# Patient Record
Sex: Female | Born: 1960 | Race: White | Hispanic: No | Marital: Married | Smoking: Former smoker
Health system: Southern US, Community
[De-identification: ages and names within clinical notes are randomized; demographics above are authoritative.]

## PROBLEM LIST (undated history)

## (undated) DIAGNOSIS — N939 Abnormal uterine and vaginal bleeding, unspecified: Secondary | ICD-10-CM

## (undated) DIAGNOSIS — N289 Disorder of kidney and ureter, unspecified: Secondary | ICD-10-CM

## (undated) DIAGNOSIS — K219 Gastro-esophageal reflux disease without esophagitis: Secondary | ICD-10-CM

## (undated) DIAGNOSIS — K449 Diaphragmatic hernia without obstruction or gangrene: Secondary | ICD-10-CM

## (undated) DIAGNOSIS — I10 Essential (primary) hypertension: Secondary | ICD-10-CM

## (undated) DIAGNOSIS — F419 Anxiety disorder, unspecified: Secondary | ICD-10-CM

## (undated) DIAGNOSIS — D259 Leiomyoma of uterus, unspecified: Secondary | ICD-10-CM

## (undated) HISTORY — DX: Gastro-esophageal reflux disease without esophagitis: K21.9

## (undated) HISTORY — DX: Diaphragmatic hernia without obstruction or gangrene: K44.9

## (undated) HISTORY — PX: DILATION AND CURETTAGE OF UTERUS: SHX78

## (undated) HISTORY — DX: Leiomyoma of uterus, unspecified: D25.9

## (undated) HISTORY — DX: Anxiety disorder, unspecified: F41.9

## (undated) HISTORY — DX: Essential (primary) hypertension: I10

## (undated) HISTORY — PX: TUBAL LIGATION: SHX77

---

## 2005-06-03 ENCOUNTER — Ambulatory Visit: Payer: Self-pay

## 2005-06-17 ENCOUNTER — Ambulatory Visit: Payer: Self-pay | Admitting: Obstetrics & Gynecology

## 2006-02-28 ENCOUNTER — Ambulatory Visit: Payer: Self-pay

## 2006-08-04 ENCOUNTER — Ambulatory Visit: Payer: Self-pay | Admitting: General Surgery

## 2006-08-04 HISTORY — PX: COLONOSCOPY: SHX174

## 2006-08-04 HISTORY — PX: UPPER GI ENDOSCOPY: SHX6162

## 2007-08-29 ENCOUNTER — Ambulatory Visit: Payer: Self-pay | Admitting: Obstetrics & Gynecology

## 2009-03-13 ENCOUNTER — Ambulatory Visit: Payer: Self-pay

## 2010-07-06 ENCOUNTER — Ambulatory Visit: Payer: Self-pay

## 2010-11-24 LAB — HEMOGLOBIN A1C: HEMOGLOBIN A1C: 5.7

## 2011-11-24 ENCOUNTER — Ambulatory Visit: Payer: Self-pay

## 2012-03-08 ENCOUNTER — Inpatient Hospital Stay: Payer: Self-pay | Admitting: Internal Medicine

## 2012-03-08 LAB — CK: CK, Total: 44 U/L (ref 21–215)

## 2012-03-08 LAB — URINALYSIS, COMPLETE
Ph: 5 (ref 4.5–8.0)
Protein: 100
RBC,UR: 13 /HPF (ref 0–5)
Specific Gravity: 1.008 (ref 1.003–1.030)
Squamous Epithelial: 3
Transitional Epi: <1
WBC UR: 94 /HPF (ref 0–5)

## 2012-03-08 LAB — COMPREHENSIVE METABOLIC PANEL
Chloride: 84 mmol/L — ABNORMAL LOW (ref 98–107)
Co2: 23 mmol/L (ref 21–32)
Creatinine: 3.99 mg/dL — ABNORMAL HIGH (ref 0.60–1.30)
EGFR (African American): 14 — ABNORMAL LOW
EGFR (Non-African Amer.): 12 — ABNORMAL LOW
Glucose: 143 mg/dL — ABNORMAL HIGH (ref 65–99)
Potassium: 3 mmol/L — ABNORMAL LOW (ref 3.5–5.1)
SGOT(AST): 38 U/L — ABNORMAL HIGH (ref 15–37)
SGPT (ALT): 41 U/L

## 2012-03-08 LAB — LIPASE, BLOOD: Lipase: 39 U/L — ABNORMAL LOW (ref 73–393)

## 2012-03-08 LAB — CBC
MCHC: 34.9 g/dL (ref 32.0–36.0)
MCV: 101 fL — ABNORMAL HIGH (ref 80–100)
Platelet: 128 10*3/uL — ABNORMAL LOW (ref 150–440)

## 2012-03-08 LAB — MAGNESIUM: Magnesium: 1 mg/dL — ABNORMAL LOW

## 2012-03-09 LAB — BASIC METABOLIC PANEL
BUN: 41 mg/dL — ABNORMAL HIGH (ref 7–18)
Calcium, Total: 7.1 mg/dL — ABNORMAL LOW (ref 8.5–10.1)
Chloride: 93 mmol/L — ABNORMAL LOW (ref 98–107)
Co2: 17 mmol/L — ABNORMAL LOW (ref 21–32)
Creatinine: 3.81 mg/dL — ABNORMAL HIGH (ref 0.60–1.30)
EGFR (African American): 15 — ABNORMAL LOW
Glucose: 101 mg/dL — ABNORMAL HIGH (ref 65–99)
Osmolality: 262 (ref 275–301)
Potassium: 2.9 mmol/L — ABNORMAL LOW (ref 3.5–5.1)
Sodium: 125 mmol/L — ABNORMAL LOW (ref 136–145)

## 2012-03-09 LAB — MAGNESIUM: Magnesium: 1.6 mg/dL — ABNORMAL LOW

## 2012-03-09 LAB — CK: CK, Total: 38 U/L (ref 21–215)

## 2012-03-10 LAB — COMPREHENSIVE METABOLIC PANEL
Albumin: 2 g/dL — ABNORMAL LOW (ref 3.4–5.0)
Alkaline Phosphatase: 87 U/L (ref 50–136)
Anion Gap: 12 (ref 7–16)
BUN: 43 mg/dL — ABNORMAL HIGH (ref 7–18)
Calcium, Total: 7.3 mg/dL — ABNORMAL LOW (ref 8.5–10.1)
Chloride: 95 mmol/L — ABNORMAL LOW (ref 98–107)
Co2: 15 mmol/L — ABNORMAL LOW (ref 21–32)
Creatinine: 3.67 mg/dL — ABNORMAL HIGH (ref 0.60–1.30)
EGFR (Non-African Amer.): 14 — ABNORMAL LOW
Glucose: 106 mg/dL — ABNORMAL HIGH (ref 65–99)
Osmolality: 257 (ref 275–301)
Potassium: 3.1 mmol/L — ABNORMAL LOW (ref 3.5–5.1)
SGOT(AST): 14 U/L — ABNORMAL LOW (ref 15–37)
Sodium: 122 mmol/L — ABNORMAL LOW (ref 136–145)

## 2012-03-10 LAB — CBC WITH DIFFERENTIAL/PLATELET
Basophil %: 0.1 %
Eosinophil #: 0.2 10*3/uL (ref 0.0–0.7)
Eosinophil %: 2 %
MCH: 35.6 pg — ABNORMAL HIGH (ref 26.0–34.0)
MCHC: 34.9 g/dL (ref 32.0–36.0)
MCV: 102 fL — ABNORMAL HIGH (ref 80–100)
Monocyte #: 1.5 x10 3/mm — ABNORMAL HIGH (ref 0.2–0.9)
Monocyte %: 14.9 %
Neutrophil #: 8 10*3/uL — ABNORMAL HIGH (ref 1.4–6.5)
Platelet: 96 10*3/uL — ABNORMAL LOW (ref 150–440)
RBC: 2.91 10*6/uL — ABNORMAL LOW (ref 3.80–5.20)
RDW: 13.5 % (ref 11.5–14.5)
WBC: 9.9 10*3/uL (ref 3.6–11.0)

## 2012-03-10 LAB — URINE CULTURE

## 2012-03-10 LAB — PROTEIN / CREATININE RATIO, URINE
Protein, Random Urine: 31 mg/dL — ABNORMAL HIGH (ref 0–12)
Protein/Creat. Ratio: 1498 mg/gCREAT — ABNORMAL HIGH (ref 0–200)

## 2012-03-10 LAB — MAGNESIUM: Magnesium: 2 mg/dL

## 2012-03-11 LAB — CBC WITH DIFFERENTIAL/PLATELET
Eosinophil #: 0.2 10*3/uL (ref 0.0–0.7)
Eosinophil %: 2.4 %
HCT: 28.1 % — ABNORMAL LOW (ref 35.0–47.0)
HGB: 9.8 g/dL — ABNORMAL LOW (ref 12.0–16.0)
Lymphocyte %: 3 %
MCH: 35.6 pg — ABNORMAL HIGH (ref 26.0–34.0)
MCHC: 34.9 g/dL (ref 32.0–36.0)
MCV: 102 fL — ABNORMAL HIGH (ref 80–100)
Monocyte #: 1.5 x10 3/mm — ABNORMAL HIGH (ref 0.2–0.9)
Monocyte %: 15.3 %
Neutrophil #: 7.8 10*3/uL — ABNORMAL HIGH (ref 1.4–6.5)
Neutrophil %: 79.1 %
RBC: 2.76 10*6/uL — ABNORMAL LOW (ref 3.80–5.20)
WBC: 9.8 10*3/uL (ref 3.6–11.0)

## 2012-03-11 LAB — CULTURE, BLOOD (SINGLE)

## 2012-03-11 LAB — BASIC METABOLIC PANEL
BUN: 35 mg/dL — ABNORMAL HIGH (ref 7–18)
Calcium, Total: 7.7 mg/dL — ABNORMAL LOW (ref 8.5–10.1)
Chloride: 101 mmol/L (ref 98–107)
Co2: 19 mmol/L — ABNORMAL LOW (ref 21–32)
EGFR (African American): 19 — ABNORMAL LOW
Osmolality: 269 (ref 275–301)
Sodium: 130 mmol/L — ABNORMAL LOW (ref 136–145)

## 2012-03-11 LAB — PROTIME-INR
INR: 1
Prothrombin Time: 13.9 secs (ref 11.5–14.7)

## 2012-03-11 LAB — APTT: Activated PTT: 27.8 secs (ref 23.6–35.9)

## 2012-03-11 LAB — FIBRIN DEGRADATION PROD.(ARMC ONLY): Fibrin Degradation Prod.: >10 — ABNORMAL HIGH (ref 2.1–7.7)

## 2012-03-12 LAB — BASIC METABOLIC PANEL
Anion Gap: 14 (ref 7–16)
BUN: 27 mg/dL — ABNORMAL HIGH (ref 7–18)
Calcium, Total: 8.4 mg/dL — ABNORMAL LOW (ref 8.5–10.1)
Co2: 19 mmol/L — ABNORMAL LOW (ref 21–32)
Creatinine: 2.29 mg/dL — ABNORMAL HIGH (ref 0.60–1.30)
EGFR (African American): 28 — ABNORMAL LOW
EGFR (Non-African Amer.): 24 — ABNORMAL LOW
Osmolality: 277 (ref 275–301)
Potassium: 3.1 mmol/L — ABNORMAL LOW (ref 3.5–5.1)
Sodium: 136 mmol/L (ref 136–145)

## 2012-03-12 LAB — CBC WITH DIFFERENTIAL/PLATELET
Basophil %: 0.3 %
Lymphocyte #: 0.5 10*3/uL — ABNORMAL LOW (ref 1.0–3.6)
MCH: 35.2 pg — ABNORMAL HIGH (ref 26.0–34.0)
MCHC: 34 g/dL (ref 32.0–36.0)
Monocyte #: 1.1 x10 3/mm — ABNORMAL HIGH (ref 0.2–0.9)
Monocyte %: 12.5 %
Platelet: 172 10*3/uL (ref 150–440)
RBC: 3.05 10*6/uL — ABNORMAL LOW (ref 3.80–5.20)
RDW: 13.9 % (ref 11.5–14.5)
WBC: 8.9 10*3/uL (ref 3.6–11.0)

## 2012-03-13 LAB — UR PROT ELECTROPHORESIS, URINE RANDOM

## 2014-03-26 LAB — BASIC METABOLIC PANEL
Creatinine: 1.1 mg/dL (ref 0.5–1.1)
Glucose: 99 mg/dL

## 2014-03-26 LAB — LIPID PANEL
CHOLESTEROL: 201 mg/dL — AB (ref 0–200)
HDL: 52 mg/dL (ref 35–70)
LDL Cholesterol: 81 mg/dL
Triglycerides: 342 mg/dL — AB (ref 40–160)

## 2014-05-01 ENCOUNTER — Ambulatory Visit (INDEPENDENT_AMBULATORY_CARE_PROVIDER_SITE_OTHER): Payer: BC Managed Care – PPO | Admitting: Cardiovascular Disease

## 2014-05-01 ENCOUNTER — Encounter (INDEPENDENT_AMBULATORY_CARE_PROVIDER_SITE_OTHER): Payer: Self-pay

## 2014-05-01 ENCOUNTER — Encounter: Payer: Self-pay | Admitting: Cardiovascular Disease

## 2014-05-01 VITALS — BP 140/92 | HR 66 | Ht 65.0 in | Wt 178.5 lb

## 2014-05-01 DIAGNOSIS — I158 Other secondary hypertension: Secondary | ICD-10-CM

## 2014-05-01 DIAGNOSIS — M7989 Other specified soft tissue disorders: Secondary | ICD-10-CM | POA: Insufficient documentation

## 2014-05-01 DIAGNOSIS — F419 Anxiety disorder, unspecified: Secondary | ICD-10-CM

## 2014-05-01 DIAGNOSIS — I10 Essential (primary) hypertension: Secondary | ICD-10-CM | POA: Insufficient documentation

## 2014-05-01 DIAGNOSIS — F411 Generalized anxiety disorder: Secondary | ICD-10-CM

## 2014-05-01 MED ORDER — LOSARTAN POTASSIUM 100 MG PO TABS: 100.0000 mg | ORAL_TABLET | Freq: Every day | ORAL | Status: DC

## 2014-05-01 NOTE — Assessment & Plan Note (Addendum)
For her blood pressure, we have suggested she decrease the amlodipine down to 5 mg daily. We'll add losartan 50 mg daily. If her blood pressure continues to run high, we would increase the losartan up to 100 mg daily. She has a blood pressure cuff and will monitor her blood pressure at home

## 2014-05-01 NOTE — Patient Instructions (Addendum)
Please cut the norvasc in 1/2 daily Please start 1/2 pill of the losartan daily  Monitor your blood pressure If it runs high (top number <140, bottom <90) Increase the losartan up to a full pill  Fish oil is good for triglycerides only Red Yeast Rice (OTC) for total cholestol  Please call us if you have new issues that need to be addressed before your next appt.

## 2014-05-01 NOTE — Assessment & Plan Note (Signed)
Minimal leg edema on today's visit. She has side effects from the amlodipine most likely, exacerbated by the warmer summer weather. We have recommended she decrease the amlodipine as above

## 2014-05-01 NOTE — Progress Notes (Signed)
   Patient ID: Mary Christian, female    DOB: 08/21/1961, 53 y.o.   MRN: [id number]  HPI Comments: Mary Christian is a 53 year old woman with a history of hypertension, anxiety, previous episode of acute renal failure from dehydration, urinary tract infection in June 2013, who presents for new patient evaluation for hypertension.  Reports that she was previously on Ziac and HCTZ in separate pills.  She was changed to Norvasc approximately 6 weeks ago. Since then she has had worsening lower extremity edema. Initially she was on a lower dose, changed to 10 mg. Symptoms started on the higher dose. She would like an alternate medication as her ankle edema has been significant on certain days. Otherwise she feels well, is active, leads a busy lifestyle. Works for a [employer]. No regular exercise. Weight has been slowly trending upwards She reports that her swelling is not very bad today  EKG shows normal sinus rhythm with rate 66 beats a minute, no significant ST or T wave changes    Outpatient Encounter Prescriptions as of 05/01/2014  Medication Sig  . amLODipine (NORVASC) 10 MG tablet Take 10 mg by mouth daily.  Marland Kitchen CALCIUM CITRATE-VITAMIN D PO Take 630 mg by mouth daily.  . cetirizine (ZYRTEC) 10 MG tablet Take 10 mg by mouth daily.  . Flaxseed, Linseed, (FLAXSEED OIL) 1000 MG CAPS Take 1,000 mg by mouth daily.  Marland Kitchen losartan (COZAAR) 100 MG tablet Take 1 tablet (100 mg total) by mouth daily.  Marland Kitchen omeprazole (PRILOSEC) 20 MG capsule Take 20 mg by mouth daily.  Marland Kitchen PARoxetine (PAXIL) 20 MG tablet Take 20 mg by mouth daily.   . psyllium (REGULOID) 0.52 G capsule Take 0.52 g by mouth daily.    Review of Systems  Constitutional: Negative.   HENT: Negative.   Eyes: Negative.   Respiratory: Negative.   Cardiovascular: Positive for leg swelling.  Gastrointestinal: Negative.   Endocrine: Negative.   Musculoskeletal: Negative.   Skin: Negative.   Allergic/Immunologic: Negative.   Neurological: Negative.    Hematological: Negative.   Psychiatric/Behavioral: Negative.   All other systems reviewed and are negative.   BP 140/92  Pulse 66  Ht 5\' 5"  (1.651 m)  Wt 178 lb 8 oz (80.967 kg)  BMI 29.70 kg/m2  Physical Exam  Nursing note and vitals reviewed. Constitutional: She is oriented to person, place, and time. She appears well-developed and well-nourished.  HENT:  Head: Normocephalic.  Nose: Nose normal.  Mouth/Throat: Oropharynx is clear and moist.  Eyes: Conjunctivae are normal. Pupils are equal, round, and reactive to light.  Neck: Normal range of motion. Neck supple. No JVD present.  Cardiovascular: Normal rate, regular rhythm, S1 normal, S2 normal, normal heart sounds and intact distal pulses.  Exam reveals no gallop and no friction rub.   No murmur heard. Pulmonary/Chest: Effort normal and breath sounds normal. No respiratory distress. She has no wheezes. She has no rales. She exhibits no tenderness.  Abdominal: Soft. Bowel sounds are normal. She exhibits no distension. There is no tenderness.  Musculoskeletal: Normal range of motion. She exhibits no edema and no tenderness.  Lymphadenopathy:    She has no cervical adenopathy.  Neurological: She is alert and oriented to person, place, and time. Coordination normal.  Skin: Skin is warm and dry. No rash noted. No erythema.  Psychiatric: She has a normal mood and affect. Her behavior is normal. Judgment and thought content normal.    Assessment and Plan

## 2014-05-15 ENCOUNTER — Other Ambulatory Visit: Payer: Self-pay

## 2014-05-15 ENCOUNTER — Encounter: Payer: Self-pay | Admitting: Cardiovascular Disease

## 2014-05-15 MED ORDER — AMLODIPINE BESYLATE 10 MG PO TABS
10.0000 mg | ORAL_TABLET | Freq: Every day | ORAL | Status: DC
Start: 2014-05-15 — End: 2014-12-07

## 2014-12-07 ENCOUNTER — Other Ambulatory Visit: Payer: Self-pay | Admitting: Cardiovascular Disease

## 2014-12-20 ENCOUNTER — Ambulatory Visit (INDEPENDENT_AMBULATORY_CARE_PROVIDER_SITE_OTHER): Payer: 59 | Admitting: Cardiovascular Disease

## 2014-12-20 ENCOUNTER — Encounter: Payer: Self-pay | Admitting: Cardiovascular Disease

## 2014-12-20 VITALS — BP 150/80 | HR 94 | Ht 65.0 in | Wt 178.2 lb

## 2014-12-20 DIAGNOSIS — I10 Essential (primary) hypertension: Secondary | ICD-10-CM | POA: Diagnosis not present

## 2014-12-20 MED ORDER — LOSARTAN POTASSIUM 100 MG PO TABS: 100.0000 mg | ORAL_TABLET | Freq: Every day | ORAL | Status: DC

## 2014-12-20 MED ORDER — AMLODIPINE BESYLATE 5 MG PO TABS: 5.0000 mg | ORAL_TABLET | Freq: Every day | ORAL | Status: DC

## 2014-12-20 MED ORDER — HYDROCHLOROTHIAZIDE 25 MG PO TABS: 25.0000 mg | ORAL_TABLET | Freq: Every day | ORAL | Status: DC

## 2014-12-20 NOTE — Patient Instructions (Addendum)
You are doing well.  Please take HCTZ every other day or 1/2 pill daily for blood pressure Take with banana or potassium every other day  Please call us if you have new issues that need to be addressed before your next appt.  Your physician wants you to follow-up in: 12 months.  You will receive a reminder letter in the mail two months in advance. If you don't receive a letter, please call our office to schedule the follow-up appointment.

## 2014-12-20 NOTE — Assessment & Plan Note (Addendum)
For blood pressure, we have recommended she start HCTZ every other day or one half pill daily She does report having previous episodes of renal failure in the setting of urinary tract infection She states having basic metabolic panel done through primary care last year which was normal

## 2014-12-20 NOTE — Progress Notes (Signed)
Patient ID: Mary Christian, female    DOB: 07-Sep-1961, 54 y.o.   MRN: [id number]  HPI Comments: [name] is a 54 year old woman with a history of hypertension, anxiety, previous episode of acute renal failure from dehydration, urinary tract infection in June 2013, who presents for  Follow-up of her hypertension  She is taking amlodipine 5 mill grams daily, losartan 100 mg daily. Systolic pressure is running between 140 and 150 on a regular basis. She denies any significant side effects. Leg edema has resolved with lower dose amlodipine  EKG on today's visit shows normal sinus rhythm with rate 94 bpm, rare APCs, no significant ST or T-wave changes  Other past medical history  previously on Ziac and HCTZ in separate pills.  She was changed to Norvasc   Initially she was on a lower dose, changed to 10 mg. started having leg edema      No Known Allergies  Outpatient Encounter Prescriptions as of 12/20/2014  Medication Sig  . amLODipine (NORVASC) 5 MG tablet Take 1 tablet (5 mg total) by mouth daily.  Marland Kitchen CALCIUM CITRATE-VITAMIN D PO Take 630 mg by mouth daily.  . cetirizine (ZYRTEC) 10 MG tablet Take 10 mg by mouth daily.  . Flaxseed, Linseed, (FLAXSEED OIL) 1000 MG CAPS Take 1,000 mg by mouth daily.  Marland Kitchen losartan (COZAAR) 100 MG tablet Take 1 tablet (100 mg total) by mouth daily.  Marland Kitchen omeprazole (PRILOSEC) 20 MG capsule Take 20 mg by mouth daily.  Marland Kitchen PARoxetine (PAXIL) 20 MG tablet Take 20 mg by mouth daily.   . psyllium (REGULOID) 0.52 G capsule Take 0.52 g by mouth daily.  . [DISCONTINUED] amLODipine (NORVASC) 10 MG tablet TAKE ONE TABLET BY MOUTH ONE TIME DAILY   . [DISCONTINUED] losartan (COZAAR) 100 MG tablet Take 1 tablet (100 mg total) by mouth daily.  . hydrochlorothiazide (HYDRODIURIL) 25 MG tablet Take 1 tablet (25 mg total) by mouth daily.    Past Medical History  Diagnosis Date  . Hypertension   . GERD (gastroesophageal reflux disease)   . Hiatal hernia   . Anxiety     Past  Surgical History  Procedure Laterality Date  . Cesarean section      Social History  reports that she quit smoking about 29 years ago. Her smoking use included Cigarettes. She has a 3 pack-year smoking history. She does not have any smokeless tobacco history on file. She reports that she drinks about 0.6 oz of alcohol per week. She reports that she does not use illicit drugs.  Family History family history includes Heart attack (age of onset: 72) in her father; Heart disease in her father; Hyperlipidemia in her sister and sister; Hypertension in her brother, father, sister, and sister.   Review of Systems  Constitutional: Negative.   Respiratory: Negative.   Cardiovascular: Negative.   Gastrointestinal: Negative.   Musculoskeletal: Negative.   Skin: Negative.   Neurological: Negative.   Hematological: Negative.   Psychiatric/Behavioral: Negative.   All other systems reviewed and are negative.   BP 150/80 mmHg  Pulse 94  Ht 5\' 5"  (1.651 m)  Wt 178 lb 4 oz (80.854 kg)  BMI 29.66 kg/m2  Physical Exam  Constitutional: She is oriented to person, place, and time. She appears well-developed and well-nourished.  HENT:  Head: Normocephalic.  Nose: Nose normal.  Mouth/Throat: Oropharynx is clear and moist.  Eyes: Conjunctivae are normal. Pupils are equal, round, and reactive to light.  Neck: Normal range of motion. Neck supple.  No JVD present.  Cardiovascular: Normal rate, regular rhythm, S1 normal, S2 normal, normal heart sounds and intact distal pulses.  Exam reveals no gallop and no friction rub.   No murmur heard. Pulmonary/Chest: Effort normal and breath sounds normal. No respiratory distress. She has no wheezes. She has no rales. She exhibits no tenderness.  Abdominal: Soft. Bowel sounds are normal. She exhibits no distension. There is no tenderness.  Musculoskeletal: Normal range of motion. She exhibits no edema or tenderness.  Lymphadenopathy:    She has no cervical  adenopathy.  Neurological: She is alert and oriented to person, place, and time. Coordination normal.  Skin: Skin is warm and dry. No rash noted. No erythema.  Psychiatric: She has a normal mood and affect. Her behavior is normal. Judgment and thought content normal.    Assessment and Plan  Nursing note and vitals reviewed.

## 2015-01-12 NOTE — H&P (Signed)
PATIENT NAME:  Mary, Christian MR#:  [id number] DATE OF BIRTH:  Sep 22, 1960  DATE OF ADMISSION:  03/08/2012  PRIMARY CARE PHYSICIAN: Miguel Aschoff, MD  CHIEF COMPLAINT: Dehydration and muscle weakness.  HISTORY OF PRESENT ILLNESS: This is a 54 year old female who presents with the above complaint. Over the past few days, the patient has had nausea, vomiting, and diarrhea and felt very dehydrated and she says that she has muscle pain all over her body, especially her back and arms. She had some low-grade fever and chills. No chest pain. She has decreased urine output. No dysuria. Positive frequency and urgency. In the ER, she was noted to have a urinary tract infection and elevated creatinine along with hyponatremia and hypokalemia. She is currently taking HCTZ for her blood pressure.   REVIEW OF SYSTEMS: CONSTITUTIONAL: Positive fever, fatigue, and weakness. No weight loss or gain. EYES: No blurred or double vision, glaucoma or cataracts. ENT: No ear pain, hearing loss, or seasonal allergies. RESPIRATORY: No cough, wheezing, hemoptysis, dyspnea, or chronic obstructive pulmonary disease. CARDIOVASCULAR: No chest pain, orthopnea, edema, arrhythmia, dyspnea on exertion, palpitations, or syncope. GI: Positive nausea, vomiting, and diarrhea. No abdominal pain, melena, or ulcers. GENITOURINARY: No dysuria. Positive frequency and urgency. ENDOCRINE: No polyuria, polydipsia, or thyroid problems. HEME/LYMPH: No easy bruising or anemia. SKIN: No rash or lesions. MUSCULOSKELETAL: Generalized weakness. NEURO: No history of CVA, TIA, or seizures.  PSYCH: Positive anxiety. No depression.   PAST MEDICAL HISTORY:  1. Hypertension.  2. Gastroesophageal reflux disease.  3. Anxiety.   MEDICATIONS: 1. Paxil 40 mg daily.  2. Omeprazole 20 mg daily.  3. Bisoprolol/ HCTZ 10/6.25 mg two tablets daily. 4. Flaxseed 2 capsules daily. 5. Fiber Therapy 1 tablet daily.  6. Calcium 600 with D 1 tablet daily.   ALLERGIES: No  known drug allergies.   PAST SURGICAL HISTORY: Cesarean section.   FAMILY HISTORY: Positive for colon cancer, hypertension, and coronary artery disease.   SOCIAL HISTORY: No tobacco. Occasional alcohol. No IV drug use.   PHYSICAL EXAMINATION:   VITAL SIGNS: Temperature 97, pulse 88, respirations 18, blood pressure 103/68, and saturation 100% on room air.   GENERAL: The patient is alert and oriented, not in acute distress.  HEENT: Head is atraumatic. Pupils are round and reactive. Sclerae anicteric. Mucous membranes are moist. Oropharynx is clear.   NECK: Supple without jugular venous distention, carotid bruit, or enlarged thyroid.  CARDIOVASCULAR: Regular rate and rhythm. No murmurs, gallops, or rubs. PMI is not displaced.   LUNGS: Clear to auscultation bilaterally without crackles, rales, rhonchi, or wheezing.   ABDOMEN: Bowel sounds are positive. Nontender and nondistended. No hepatosplenomegaly.   BACK: No CVA or vertebral tenderness.   EXTREMITIES: No clubbing, cyanosis, or edema.   NEURO: Cranial nerves II through XII grossly intact. No focal deficits.   STRENGTH: 4/5 strength bilaterally and symmetrically with generalized weakness. No focal neurological deficit.   SKIN: No rash or lesions.   RESULTS: Sodium 123, potassium 3.0, chloride 84, bicarbonate 23, BUN 42, creatinine 3.99, glucose 143, total protein 7.4, albumin 2.7, bilirubin 0.5, alkaline phosphatase 102, AST 30, and ALT 41. White blood cells 9.5, hemoglobin 13, hematocrit 37.1, and platelets 128.   Urinalysis shows 2+ blood, 3+ leukocyte esterase, 94 white blood cells, and 13 red blood cells.   ASSESSMENT AND PLAN: This is a 54 year old female with a history of hypertension, on HCTZ, who presents with nausea, vomiting, and diarrhea with significant acute renal failure, hyponatremia, hypokalemia, and possibly rhabdomyolysis with  muscle weakness.  1. Muscle weakness - we ordered a CK to rule out rhabdomyolysis.  I  suspect this is due to dehydration, possibly rhabdomyolysis. Pending the CK, for now we will start aggressive IV replacement. She is not on a statin because of this muscle weakness.  2. Acute renal failure from dehydration and HCTZ use - we will obviously stop the HCTZ, hydrate aggressively, and order renal ultrasound to rule out any kind of obstruction.  3. Hyponatremia, likely from HCTZ dehydration - we will hydrate the patient and repeat a BMP in the a.m.  4. Hypokalemia, which was repleted in the emergency room - I suspect this is from her nausea, vomiting, and diarrhea. We will recheck in the a.m.  5. Hypertension - we will hold HCTZ and bisoprolol for now due to dehydration and low/normal blood pressures.  6. Anxiety - we will continue Paxil 40 mg daily.   CODE STATUS: THE PATIENT IS FULL CODE STATUS.   TIME SPENT: Approximately 55 minutes.  ____________________________ Donell Beers. Benjie Karvonen, MD spm:slb D: 03/08/2012 13:45:59 ET     T: 03/08/2012 13:59:51 ET       JOB#: [id number] cc: Mary Christian P. Benjie Karvonen, MD, <Dictator> Mary L. Rosanna Randy, MD Donell Beers Mahogani Holohan MD ELECTRONICALLY SIGNED 03/08/2012 15:36

## 2015-01-12 NOTE — Discharge Summary (Signed)
PATIENT NAME:  Mary Christian, Mary Christian MR#:  [id number] DATE OF BIRTH:  Mary Christian, Mary Christian  DATE OF ADMISSION:  03/08/2012 DATE OF DISCHARGE:  03/12/2012  ADMISSION DIAGNOSES:  1. Acute renal failure from dehydration.  2. Urinary tract infection.  3. Hyponatremia.  4. Hypokalemia.  5. Hypertension.   DISCHARGE DIAGNOSES:  1. E. Coli sepsis secondary to acute pyelonephritis with Escherichia coli.  2. Shortness of breath and wheezing.  3. Acute renal failure.  4. Hypomagnesemia.   5. Hypokalemia.  6. Hyponatremia.  7. Hypertension.  8. Thrombocytopenia.  9. Anxiety.   CONSULTS: Dr. Anthonette Legato   LABS/STUDIES: Sodium 133, potassium 3.1, chloride 103, bicarbonate 19, BUN 27, creatinine 2.29, glucose 93. White blood cells 8.9, hemoglobin 11, hematocrit 32, platelets 172.   Blood cultures positive for Escherichia coli. Urine culture positive for Escherichia coli.   HOSPITAL COURSE: 54 year old female who presented with acute renal failure and weakness. For further details, please refer to the History and Physical.  1. Sepsis secondary to Escherichia coli: Likely source is urine with probable pyelonephritis. Escherichia coli is pansensitive. Due to her renal failure we will discharge her on Keflex with followup in two weeks.  2. Shortness of breath and wheezing: Chest x-ray essentially was normal. This has resolved. She does not require any oxygen.  3. Acute renal failure, suspected from sepsis, NSAID use, hypertensive medications. Appreciate renal consult. Renal ultrasound showed no evidence of hydronephrosis. Her creatinine is much improved with IV fluids.  4. Hypomagnesemia, hypokalemia, hyponatremia: All from sepsis, which were repleted.  5. Hypertension: The patient may resume her bisoprolol. Holding HCTZ due to acute renal failure.  6. Thrombocytopenia from sepsis.  Her platelets are much better as the sepsis is resolving.   DISCHARGE MEDICATIONS:  1. Paxil 20 mg, 2 tablets daily.  2. Omeprazole  20 mg daily.  3. Calcium 600 plus D 1 tablet daily.  4. Fiber therapy 1 tablet daily.  5. Flaxseed oil 2 tablets daily.  6. Acetaminophen hydrocodone 500/5 q. 6-8 hours p.r.n. pain.  7. Keflex 500 mg t.i.d.  8. Dextromethorphan guaifenesin 20 mg/10 mL solution, 5 mL q. 8-12 hours p.r.n.  9. Bisoprolol 10 mg daily.   DISCHARGE DIET: Low sodium.   DISCHARGE ACTIVITY: As tolerated.   DISCHARGE FOLLOWUP:  Follow up in 1 to 2 days with Dr. Holley Raring and her primary care physician Dr. Rosanna Randy.    TIME SPENT: Approximately 35 minutes.  ____________________________ Donell Beers. Benjie Karvonen, MD spm:bjt D: 03/12/2012 13:39:03 ET T: 03/12/2012 15:50:55 ET JOB#: [id number]  cc: Astryd Pearcy P. Benjie Karvonen, MD, <Dictator> Richard L. Rosanna Randy, MD Tama High, MD Donell Beers Franchon Ketterman MD ELECTRONICALLY SIGNED 03/13/2012 13:14

## 2015-05-08 ENCOUNTER — Other Ambulatory Visit: Payer: Self-pay | Admitting: Cardiovascular Disease

## 2015-06-10 ENCOUNTER — Other Ambulatory Visit: Payer: Self-pay | Admitting: Cardiovascular Disease

## 2015-08-27 ENCOUNTER — Other Ambulatory Visit: Payer: Self-pay | Admitting: Cardiovascular Disease

## 2015-08-27 NOTE — Telephone Encounter (Signed)
Refill sent for amlodipine.  

## 2015-09-25 ENCOUNTER — Other Ambulatory Visit: Payer: Self-pay | Admitting: Cardiovascular Disease

## 2015-11-04 ENCOUNTER — Other Ambulatory Visit: Payer: Self-pay | Admitting: *Deleted

## 2015-11-04 MED ORDER — LOSARTAN POTASSIUM 100 MG PO TABS: 100.0000 mg | ORAL_TABLET | Freq: Every day | ORAL | Status: DC

## 2016-01-30 ENCOUNTER — Ambulatory Visit (INDEPENDENT_AMBULATORY_CARE_PROVIDER_SITE_OTHER): Payer: No Typology Code available for payment source | Admitting: Cardiovascular Disease

## 2016-01-30 ENCOUNTER — Encounter: Payer: Self-pay | Admitting: Cardiovascular Disease

## 2016-01-30 VITALS — BP 130/88 | HR 84 | Ht 65.0 in | Wt 177.2 lb

## 2016-01-30 DIAGNOSIS — I10 Essential (primary) hypertension: Secondary | ICD-10-CM | POA: Diagnosis not present

## 2016-01-30 DIAGNOSIS — G4700 Insomnia, unspecified: Secondary | ICD-10-CM | POA: Diagnosis not present

## 2016-01-30 MED ORDER — LOSARTAN POTASSIUM 100 MG PO TABS: 100.0000 mg | ORAL_TABLET | Freq: Every day | ORAL | Status: DC

## 2016-01-30 MED ORDER — HYDROCHLOROTHIAZIDE 25 MG PO TABS: 25.0000 mg | ORAL_TABLET | Freq: Every day | ORAL | Status: DC

## 2016-01-30 NOTE — Assessment & Plan Note (Addendum)
Discussed clonidine, possible side effects such as dry mouth, fatigue. Blood pressure is well controlled on today's visit. No changes made to the medications. We will check basic metabolic panel today

## 2016-01-30 NOTE — Assessment & Plan Note (Signed)
Recommended she try over-the-counter medications, exercise program

## 2016-01-30 NOTE — Progress Notes (Signed)
Patient ID: Mary Christian, female    DOB: [birth year], 55 y.o.   MRN: [id number]  HPI Comments: [name] is a 55 year old woman with a history of hypertension, anxiety, previous episode of acute renal failure from dehydration in 2013, urinary tract infection in June 2013, who presents for  Follow-up of her hypertension  In follow-up, she reports that she is doing well, blood pressure is well controlled No side effects from her medications She does have insomnia, wondering if she can take clonidine at nighttime Sister takes clonidine twice a day, helps her sleep  No regular exercise program No recent lab work available  EKG on today's visit shows normal sinus rhythm with rate 84 bpm, no significant ST or T-wave changes  Other past medical history Previous  Leg edema has resolved with lower dose amlodipine   previously on Ziac and HCTZ in separate pills.  She was changed to Norvasc   Initially she was on a lower dose, changed to 10 mg. started having leg edema      No Known Allergies  Outpatient Encounter Prescriptions as of 01/30/2016  Medication Sig  . amLODipine (NORVASC) 5 MG tablet TAKE ONE TABLET BY MOUTH ONE TIME DAILY  . CALCIUM CITRATE-VITAMIN D PO Take 630 mg by mouth daily.  . cetirizine (ZYRTEC) 10 MG tablet Take 10 mg by mouth daily.  . Flaxseed, Linseed, (FLAXSEED OIL) 1000 MG CAPS Take 1,000 mg by mouth daily.  . hydrochlorothiazide (HYDRODIURIL) 25 MG tablet Take 1 tablet (25 mg total) by mouth daily.  Marland Kitchen losartan (COZAAR) 100 MG tablet Take 1 tablet (100 mg total) by mouth daily.  Marland Kitchen omeprazole (PRILOSEC) 20 MG capsule Take 20 mg by mouth daily.  Marland Kitchen PARoxetine (PAXIL) 20 MG tablet Take 20 mg by mouth daily.   . psyllium (REGULOID) 0.52 G capsule Take 0.52 g by mouth daily.  . [DISCONTINUED] hydrochlorothiazide (HYDRODIURIL) 25 MG tablet Take 1 tablet (25 mg total) by mouth daily.  . [DISCONTINUED] losartan (COZAAR) 100 MG tablet Take 1 tablet (100 mg total) by mouth  daily.   No facility-administered encounter medications on file as of 01/30/2016.    Past Medical History  Diagnosis Date  . Hypertension   . GERD (gastroesophageal reflux disease)   . Hiatal hernia   . Anxiety     Past Surgical History  Procedure Laterality Date  . Cesarean section      Social History  reports that she quit smoking about 30 years ago. Her smoking use included Cigarettes. She has a 3 pack-year smoking history. She does not have any smokeless tobacco history on file. She reports that she drinks about 0.6 oz of alcohol per week. She reports that she does not use illicit drugs.  Family History family history includes Heart attack (age of onset: 37) in her father; Heart disease in her father; Hyperlipidemia in her sister and sister; Hypertension in her brother, father, sister, and sister.   Review of Systems  Constitutional: Negative.   Respiratory: Negative.   Cardiovascular: Negative.   Gastrointestinal: Negative.   Musculoskeletal: Negative.   Neurological: Negative.   Hematological: Negative.   Psychiatric/Behavioral: Positive for sleep disturbance.  All other systems reviewed and are negative.   BP 130/88 mmHg  Pulse 84  Ht 5\' 5"  (1.651 m)  Wt 177 lb 4 oz (80.4 kg)  BMI 29.50 kg/m2  Physical Exam  Constitutional: She is oriented to person, place, and time. She appears well-developed and well-nourished.  HENT:  Head: Normocephalic.  Nose: Nose normal.  Mouth/Throat: Oropharynx is clear and moist.  Eyes: Conjunctivae are normal. Pupils are equal, round, and reactive to light.  Neck: Normal range of motion. Neck supple. No JVD present.  Cardiovascular: Normal rate, regular rhythm, S1 normal, S2 normal, normal heart sounds and intact distal pulses.  Exam reveals no gallop and no friction rub.   No murmur heard. Pulmonary/Chest: Effort normal and breath sounds normal. No respiratory distress. She has no wheezes. She has no rales. She exhibits no  tenderness.  Abdominal: Soft. Bowel sounds are normal. She exhibits no distension. There is no tenderness.  Musculoskeletal: Normal range of motion. She exhibits no edema or tenderness.  Lymphadenopathy:    She has no cervical adenopathy.  Neurological: She is alert and oriented to person, place, and time. Coordination normal.  Skin: Skin is warm and dry. No rash noted. No erythema.  Psychiatric: She has a normal mood and affect. Her behavior is normal. Judgment and thought content normal.    Assessment and Plan  Nursing note and vitals reviewed.

## 2016-01-30 NOTE — Patient Instructions (Addendum)
You are doing well. No medication changes were made.  We will check BMP today, to look at renal function and potassium level  Please call us if you have new issues that need to be addressed before your next appt.  Your physician wants you to follow-up in: 12 months.  You will receive a reminder letter in the mail two months in advance. If you don't receive a letter, please call our office to schedule the follow-up appointment.

## 2016-01-31 LAB — BASIC METABOLIC PANEL
BUN / CREAT RATIO: 16 (ref 9–23)
BUN: 16 mg/dL (ref 6–24)
CHLORIDE: 95 mmol/L — AB (ref 96–106)
CO2: 21 mmol/L (ref 18–29)
Calcium: 9.8 mg/dL (ref 8.7–10.2)
Creatinine, Ser: 0.97 mg/dL (ref 0.57–1.00)
GFR calc Af Amer: 77 mL/min/{1.73_m2} (ref >59)
GFR calc non Af Amer: 67 mL/min/{1.73_m2} (ref >59)
GLUCOSE: 90 mg/dL (ref 65–99)
POTASSIUM: 4.4 mmol/L (ref 3.5–5.2)
SODIUM: 138 mmol/L (ref 134–144)

## 2016-02-19 DIAGNOSIS — D259 Leiomyoma of uterus, unspecified: Secondary | ICD-10-CM

## 2016-02-19 HISTORY — DX: Leiomyoma of uterus, unspecified: D25.9

## 2016-02-29 MED ORDER — BUPIVACAINE-EPINEPHRINE (PF) 0.25% -1:200000 IJ SOLN
INTRAMUSCULAR | Status: AC
  Filled 2016-02-29: qty 30

## 2016-03-01 ENCOUNTER — Encounter
Admission: RE | Admit: 2016-03-01 | Discharge: 2016-03-01 | Disposition: A | Discharge status: Home / Self Care | Payer: No Typology Code available for payment source | Source: Ambulatory Visit | Attending: Obstetrics and Gynecology | Admitting: Obstetrics and Gynecology

## 2016-03-01 DIAGNOSIS — Z01812 Encounter for preprocedural laboratory examination: Secondary | ICD-10-CM | POA: Insufficient documentation

## 2016-03-01 HISTORY — DX: Disorder of kidney and ureter, unspecified: N28.9

## 2016-03-01 HISTORY — DX: Abnormal uterine and vaginal bleeding, unspecified: N93.9

## 2016-03-01 LAB — COMPREHENSIVE METABOLIC PANEL
ALBUMIN: 4.5 g/dL (ref 3.5–5.0)
ALT: 27 U/L (ref 14–54)
AST: 27 U/L (ref 15–41)
Alkaline Phosphatase: 69 U/L (ref 38–126)
Anion gap: 10 (ref 5–15)
BUN: 21 mg/dL — ABNORMAL HIGH (ref 6–20)
CHLORIDE: 102 mmol/L (ref 101–111)
CO2: 24 mmol/L (ref 22–32)
CREATININE: 1.25 mg/dL — AB (ref 0.44–1.00)
Calcium: 9.4 mg/dL (ref 8.9–10.3)
GFR calc non Af Amer: 48 mL/min — ABNORMAL LOW (ref >60)
GFR, EST AFRICAN AMERICAN: 56 mL/min — AB (ref >60)
GLUCOSE: 79 mg/dL (ref 65–99)
Potassium: 4.2 mmol/L (ref 3.5–5.1)
SODIUM: 136 mmol/L (ref 135–145)
Total Bilirubin: 0.3 mg/dL (ref 0.3–1.2)
Total Protein: 7.7 g/dL (ref 6.5–8.1)

## 2016-03-01 LAB — TYPE AND SCREEN
ABO/RH(D): O POS
ANTIBODY SCREEN: NEGATIVE

## 2016-03-01 LAB — CBC
HCT: 39 % (ref 35.0–47.0)
Hemoglobin: 13.8 g/dL (ref 12.0–16.0)
MCH: 34.6 pg — AB (ref 26.0–34.0)
MCHC: 35.3 g/dL (ref 32.0–36.0)
MCV: 97.9 fL (ref 80.0–100.0)
PLATELETS: 277 10*3/uL (ref 150–440)
RBC: 3.99 MIL/uL (ref 3.80–5.20)
RDW: 13.1 % (ref 11.5–14.5)
WBC: 5.9 10*3/uL (ref 3.6–11.0)

## 2016-03-01 NOTE — Patient Instructions (Addendum)
  Your procedure is scheduled on: 03/05/16 Fri Report to Same Day Surgery 2nd floor medical mall To find out your arrival time please call [phone] between 1PM - 3PM on 03/04/16 Thurs  Remember: Instructions that are not followed completely may result in serious medical risk, up to and including death, or upon the discretion of your surgeon and anesthesiologist your surgery may need to be rescheduled.    _x___ 1. Do not eat food or drink liquids after midnight. No gum chewing or hard candies.     __x__ 2. No Alcohol for 24 hours before or after surgery.   ____ 3. Bring all medications with you on the day of surgery if instructed.    __x__ 4. Notify your doctor if there is any change in your medical condition     (cold, fever, infections).     Do not wear jewelry, make-up, hairpins, clips or nail polish.  Do not wear lotions, powders, or perfumes. You may wear deodorant.  Do not shave 48 hours prior to surgery. Men may shave face and neck.  Do not bring valuables to the hospital.    [facility] is not responsible for any belongings or valuables.               Contacts, dentures or bridgework may not be worn into surgery.  Leave your suitcase in the car. After surgery it may be brought to your room.  For patients admitted to the hospital, discharge time is determined by your treatment team.   Patients discharged the day of surgery will not be allowed to drive home.    Please read over the following fact sheets that you were given:   [facility] Preparing for Surgery and or MRSA Information   _x___ Take these medicines the morning of surgery with A SIP OF WATER:    1. amLODipine (NORVASC) 5 MG tablet  2.losartan (COZAAR) 100 MG tablet  3.omeprazole (PRILOSEC) 20 MG capsule  4.PARoxetine (PAXIL) 20 MG tablet  5.  6.  ____ Fleet Enema (as directed)   _x___ Use CHG Soap or sage wipes as directed on instruction sheet   ____ Use inhalers on the day of surgery and bring to  hospital day of surgery  ____ Stop metformin 2 days prior to surgery    ____ Take 1/2 of usual insulin dose the night before surgery and none on the morning of           surgery.   ____ Stop aspirin or coumadin, or plavix  _x__ Stop Anti-inflammatories such as Advil, Aleve, Ibuprofen, Motrin, Naproxen,          Naprosyn, Goodies powders or aspirin products. Ok to take Tylenol.   __x__ Stop supplements until after surgery.  Stopped flax seed a week ago.  ____ Bring C-Pap to the hospital.

## 2016-03-05 ENCOUNTER — Ambulatory Visit: Payer: No Typology Code available for payment source | Admitting: Anesthesiology

## 2016-03-05 ENCOUNTER — Ambulatory Visit
Admission: RE | Admit: 2016-03-05 | Discharge: 2016-03-05 | Disposition: A | Discharge status: Home / Self Care | Payer: No Typology Code available for payment source | Source: Ambulatory Visit | Attending: Obstetrics and Gynecology | Admitting: Obstetrics and Gynecology

## 2016-03-05 ENCOUNTER — Encounter
Admission: RE | Disposition: A | Discharge status: Home / Self Care | Payer: Self-pay | Source: Ambulatory Visit | Attending: Obstetrics and Gynecology

## 2016-03-05 ENCOUNTER — Encounter: Payer: Self-pay | Admitting: *Deleted

## 2016-03-05 DIAGNOSIS — D259 Leiomyoma of uterus, unspecified: Secondary | ICD-10-CM | POA: Diagnosis not present

## 2016-03-05 DIAGNOSIS — I10 Essential (primary) hypertension: Secondary | ICD-10-CM | POA: Diagnosis not present

## 2016-03-05 DIAGNOSIS — G709 Myoneural disorder, unspecified: Secondary | ICD-10-CM | POA: Insufficient documentation

## 2016-03-05 DIAGNOSIS — F419 Anxiety disorder, unspecified: Secondary | ICD-10-CM | POA: Insufficient documentation

## 2016-03-05 DIAGNOSIS — N950 Postmenopausal bleeding: Secondary | ICD-10-CM | POA: Diagnosis not present

## 2016-03-05 DIAGNOSIS — Z87891 Personal history of nicotine dependence: Secondary | ICD-10-CM | POA: Diagnosis not present

## 2016-03-05 DIAGNOSIS — N711 Chronic inflammatory disease of uterus: Secondary | ICD-10-CM | POA: Diagnosis not present

## 2016-03-05 DIAGNOSIS — N921 Excessive and frequent menstruation with irregular cycle: Secondary | ICD-10-CM | POA: Diagnosis present

## 2016-03-05 HISTORY — PX: DILATATION & CURETTAGE/HYSTEROSCOPY WITH MYOSURE: SHX6511

## 2016-03-05 LAB — POCT PREGNANCY, URINE: Preg Test, Ur: NEGATIVE

## 2016-03-05 SURGERY — DILATATION & CURETTAGE/HYSTEROSCOPY WITH MYOSURE
Anesthesia: General | Wound class: Clean Contaminated

## 2016-03-05 MED ORDER — ACETAMINOPHEN-CODEINE #3 300-30 MG PO TABS: 1.0000 | ORAL_TABLET | Freq: Four times a day (QID) | ORAL | Status: DC | PRN

## 2016-03-05 MED ORDER — FENTANYL CITRATE (PF) 100 MCG/2ML IJ SOLN
25.0000 ug | INTRAMUSCULAR | Status: DC | PRN
  Administered 2016-03-05 (×4): 25 ug via INTRAVENOUS

## 2016-03-05 MED ORDER — ACETAMINOPHEN-CODEINE #3 300-30 MG PO TABS
ORAL_TABLET | ORAL | Status: AC
  Filled 2016-03-05: qty 1

## 2016-03-05 MED ORDER — DEXAMETHASONE SODIUM PHOSPHATE 10 MG/ML IJ SOLN
INTRAMUSCULAR | Status: DC | PRN
  Administered 2016-03-05: 5 mg via INTRAVENOUS

## 2016-03-05 MED ORDER — LIDOCAINE HCL (CARDIAC) 20 MG/ML IV SOLN
INTRAVENOUS | Status: DC | PRN
  Administered 2016-03-05: 80 mg via INTRAVENOUS

## 2016-03-05 MED ORDER — LACTATED RINGERS IV SOLN
INTRAVENOUS | Status: DC
Start: 2016-03-05 — End: 2016-03-05
  Administered 2016-03-05: 14:00:00 via INTRAVENOUS

## 2016-03-05 MED ORDER — PHENYLEPHRINE HCL 10 MG/ML IJ SOLN
INTRAMUSCULAR | Status: DC | PRN
  Administered 2016-03-05: 50 ug via INTRAVENOUS

## 2016-03-05 MED ORDER — GLYCOPYRROLATE 0.2 MG/ML IJ SOLN
INTRAMUSCULAR | Status: DC | PRN
  Administered 2016-03-05: 0.2 mg via INTRAVENOUS

## 2016-03-05 MED ORDER — ACETAMINOPHEN-CODEINE #2 300-15 MG PO TABS: 1.0000 | ORAL_TABLET | Freq: Four times a day (QID) | ORAL | Status: DC | PRN

## 2016-03-05 MED ORDER — ONDANSETRON HCL 4 MG/2ML IJ SOLN
INTRAMUSCULAR | Status: DC | PRN
Start: 2016-03-05 — End: 2016-03-05
  Administered 2016-03-05: 4 mg via INTRAVENOUS

## 2016-03-05 MED ORDER — LACTATED RINGERS IV SOLN
INTRAVENOUS | Status: DC
Start: 2016-03-05 — End: 2016-03-05

## 2016-03-05 MED ORDER — ACETAMINOPHEN-CODEINE #3 300-30 MG PO TABS
1.0000 | ORAL_TABLET | ORAL | Status: AC
  Administered 2016-03-05: 1 via ORAL

## 2016-03-05 MED ORDER — FENTANYL CITRATE (PF) 100 MCG/2ML IJ SOLN
INTRAMUSCULAR | Status: DC | PRN
  Administered 2016-03-05: 100 ug via INTRAVENOUS

## 2016-03-05 MED ORDER — FENTANYL CITRATE (PF) 100 MCG/2ML IJ SOLN
INTRAMUSCULAR | Status: AC
  Administered 2016-03-05: 25 ug via INTRAVENOUS
  Filled 2016-03-05: qty 2

## 2016-03-05 MED ORDER — ONDANSETRON HCL 4 MG/2ML IJ SOLN: 4.0000 mg | Freq: Once | INTRAMUSCULAR | Status: DC | PRN

## 2016-03-05 MED ORDER — PROPOFOL 10 MG/ML IV BOLUS
INTRAVENOUS | Status: DC | PRN
  Administered 2016-03-05: 100 mg via INTRAVENOUS
  Administered 2016-03-05: 150 mg via INTRAVENOUS
  Administered 2016-03-05: 50 mg via INTRAVENOUS

## 2016-03-05 MED ORDER — MIDAZOLAM HCL 2 MG/2ML IJ SOLN
INTRAMUSCULAR | Status: DC | PRN
  Administered 2016-03-05: 2 mg via INTRAVENOUS

## 2016-03-05 SURGICAL SUPPLY — 20 items
ABLATOR ENDOMETRIAL MYOSURE (ABLATOR) ×3 IMPLANT
CANISTER SUC SOCK COL 7IN (MISCELLANEOUS) ×3 IMPLANT
CATH ROBINSON RED A/P 16FR (CATHETERS) ×3 IMPLANT
ELECT REM PT RETURN 9FT ADLT (ELECTROSURGICAL) ×3
ELECTRODE REM PT RTRN 9FT ADLT (ELECTROSURGICAL) ×1 IMPLANT
GLOVE BIO SURGEON STRL SZ7 (GLOVE) ×3 IMPLANT
GLOVE BIOGEL PI IND STRL 7.5 (GLOVE) ×2 IMPLANT
GLOVE BIOGEL PI INDICATOR 7.5 (GLOVE) ×4
GOWN STRL REUS W/ TWL LRG LVL3 (GOWN DISPOSABLE) ×4 IMPLANT
GOWN STRL REUS W/TWL LRG LVL3 (GOWN DISPOSABLE) ×8
IV LACTATED RINGER IRRG 3000ML (IV SOLUTION) ×4
IV LR IRRIG 3000ML ARTHROMATIC (IV SOLUTION) ×2 IMPLANT
KIT RM TURNOVER CYSTO AR (KITS) ×3 IMPLANT
MYOSURE LITE POLYP REMOVAL (MISCELLANEOUS) ×6 IMPLANT
PACK DNC HYST (MISCELLANEOUS) ×3 IMPLANT
PAD OB MATERNITY 4.3X12.25 (PERSONAL CARE ITEMS) ×3 IMPLANT
PAD PREP 24X41 OB/GYN DISP (PERSONAL CARE ITEMS) ×3 IMPLANT
TUBING CONNECTING 10 (TUBING) ×2 IMPLANT
TUBING CONNECTING 10' (TUBING) ×1
TUBING HYSTEROSCOPY DOLPHIN (MISCELLANEOUS) ×3 IMPLANT

## 2016-03-05 NOTE — Transfer of Care (Signed)
Immediate Anesthesia Transfer of Care Note  Patient: Mary Christian  Procedure(s) Performed: Procedure(s): DILATATION & CURETTAGE/HYSTEROSCOPY WITH MYOSURE (N/A)  Patient Location: PACU  Anesthesia Type:General  Level of Consciousness: awake  Airway & Oxygen Therapy: Patient connected to face mask oxygen  Post-op Assessment: Post -op Vital signs reviewed and stable  Post vital signs: stable  Last Vitals:  Filed Vitals:   03/05/16 1322 03/05/16 1524  BP: 149/109 149/94  Pulse: 94 116  Temp: 36.9 C 37.1 C  Resp: 20 19    Last Pain: There were no vitals filed for this visit.    Patients Stated Pain Goal: 0 ([id number])  Complications: No apparent anesthesia complications

## 2016-03-05 NOTE — H&P (Signed)
History and Physical Interval Note:  Mary Christian  has presented today for surgery, with the diagnosis of cervical fibroid  The various methods of treatment have been discussed with the patient and family. After consideration of risks, benefits and other options for treatment, the patient has consented to  Procedure(s): [place] (N/A) as a surgical intervention .  The patient's history has been reviewed, patient examined, no change in status, stable for surgery.  I have reviewed the patient's chart and labs.  Questions were answered to the patient's satisfaction.    Will Bonnet, MD 03/05/2016 2:17 PM

## 2016-03-05 NOTE — Discharge Instructions (Signed)
AMBULATORY SURGERY  °DISCHARGE INSTRUCTIONS ° ° °1) The drugs that you were given will stay in your system until tomorrow so for the next 24 hours you should not: ° °A) Drive an automobile °B) Make any legal decisions °C) Drink any alcoholic beverage ° ° °2) You may resume regular meals tomorrow.  Today it is better to start with liquids and gradually work up to solid foods. ° °You may eat anything you prefer, but it is better to start with liquids, then soup and crackers, and gradually work up to solid foods. ° ° °3) Please notify your doctor immediately if you have any unusual bleeding, trouble breathing, redness and pain at the surgery site, drainage, fever, or pain not relieved by medication. ° ° ° °4) Additional Instructions: ° ° ° ° ° ° ° °Please contact your physician with any problems or Same Day Surgery at [phone], Monday through Friday 6 am to 4 pm, or [place] [facility] number at [phone]. °

## 2016-03-05 NOTE — Anesthesia Preprocedure Evaluation (Signed)
Anesthesia Evaluation  Patient identified by MRN, date of birth, ID band Patient awake    Reviewed: Allergy & Precautions, NPO status , Patient's Chart, lab work & pertinent test results, reviewed documented beta blocker date and time   Airway Mallampati: II  TM Distance: >3 FB     Dental  (+) Chipped   Pulmonary former smoker,           Cardiovascular hypertension, Pt. on medications      Neuro/Psych Anxiety  Neuromuscular disease    GI/Hepatic   Endo/Other    Renal/GU Renal InsufficiencyRenal disease     Musculoskeletal   Abdominal   Peds  Hematology   Anesthesia Other Findings   Reproductive/Obstetrics                             Anesthesia Physical Anesthesia Plan  ASA: III  Anesthesia Plan: General   Post-op Pain Management:    Induction: Intravenous  Airway Management Planned: LMA  Additional Equipment:   Intra-op Plan:   Post-operative Plan:   Informed Consent: I have reviewed the patients History and Physical, chart, labs and discussed the procedure including the risks, benefits and alternatives for the proposed anesthesia with the patient or authorized representative who has indicated his/her understanding and acceptance.     Plan Discussed with: CRNA  Anesthesia Plan Comments:         Anesthesia Quick Evaluation

## 2016-03-05 NOTE — Anesthesia Postprocedure Evaluation (Signed)
Anesthesia Post Note  Patient: Mary Christian  Procedure(s) Performed: Procedure(s) (LRB): DILATATION & CURETTAGE/HYSTEROSCOPY WITH MYOSURE (N/A)  Patient location during evaluation: PACU Anesthesia Type: General Level of consciousness: awake and alert Pain management: pain level controlled Vital Signs Assessment: post-procedure vital signs reviewed and stable Respiratory status: spontaneous breathing and respiratory function stable Cardiovascular status: stable Anesthetic complications: no    Last Vitals:  Filed Vitals:   03/05/16 1552 03/05/16 1554  BP:  150/92  Pulse: 105 102  Temp:    Resp: 14 17    Last Pain:  Filed Vitals:   03/05/16 1555  PainSc: 4                  Shaquinta Peruski K

## 2016-03-05 NOTE — Anesthesia Procedure Notes (Signed)
Procedure Name: LMA Insertion Date/Time: 03/05/2016 2:28 PM Performed by: Aline Brochure Pre-anesthesia Checklist: Patient identified, Emergency Drugs available, Suction available and Patient being monitored Patient Re-evaluated:Patient Re-evaluated prior to inductionOxygen Delivery Method: Circle system utilized Preoxygenation: Pre-oxygenation with 100% oxygen Intubation Type: IV induction Ventilation: Mask ventilation without difficulty LMA: LMA inserted LMA Size: 3.5 Number of attempts: 1 Placement Confirmation: positive ETCO2 and breath sounds checked- equal and bilateral Tube secured with: Tape Dental Injury: Teeth and Oropharynx as per pre-operative assessment

## 2016-03-05 NOTE — Op Note (Signed)
Operative Report  Pre-Op Diagnosis:  1) Cervical Fibroid 2) postmenopausal bleeding  Post-Op Diagnosis:  1) Cervical Fibroid 2) postmenopausal bleeding  Procedures:  Cervical myomectomy using MyoSure  Primary Surgeon: Prentice Docker, MD   EBL: 5 ml   IVF: 700 mL   Urine output: 50 mL  Fluid Deficit: 700 mL  Specimens: Cervical Fibroid  Drains: None  Complications: None   Disposition: PACU   Condition: Stable   Findings: Exam under anesthesia revealed small, mobile uterus with no masses and bilateral adnexa without masses or fullness.   Hysteroscopy revealed 1 cm x 3 cm fibroid starting and left posterior proximal endocervical canal protruding through the cervical os, otherwise grossly normal appearing uterine cavity with bilateral tubal ostia and normal appearing endocervical canal.   Procedure Summary:   PROCEDURE IN DETAIL: After informed consent was obtained, the patient was taken to the operating room where anesthesia was obtained without difficulty. The patient was positioned in the dorsal lithotomy position in candy cane stirrups. The patient's bladder was catheterized with an in and out foley catheter. The patient was examined under anesthesia, with the above noted findings. The bivalved speculum was placed inside the patient's vagina, and the the anterior lip of the cervix was seen and grasped with the tenaculum. The 30 degree hysteroscope was introduced, with LR fluid used to distend the intrauterine cavity, with the above noted findings.   The MyoSure device was used to remove all fibroid material from the cervix.  Inspection of the uterine cavity revealed essentially normal findings.  Once all fibroid material was removed, the hysteroscope was removed.  Hemostasis was noted from the cervical os. Review again of the uterine cavity revealed no injury to the uterine wall.  The hysteroscope was removed. Tenaculum was removed with excellent hemostasis noted. Speculum  was then removed.  She was then taken out of dorsal lithotomy.   The patient tolerated the procedure well. Sponge, lap and needle counts were correct x2. The patient was taken to recovery room in excellent condition.  VTE prophylaxis: SCDs.  Antibiotics: none indicated or administered.  Prentice Docker, MD 03/05/2016 3:13 PM

## 2016-03-08 ENCOUNTER — Encounter: Payer: Self-pay | Admitting: Obstetrics and Gynecology

## 2016-03-09 LAB — SURGICAL PATHOLOGY

## 2016-06-03 ENCOUNTER — Telehealth: Payer: Self-pay | Admitting: Cardiovascular Disease

## 2016-06-03 NOTE — Telephone Encounter (Signed)
Received records request [employer], forwarded to [facility] for processing.

## 2016-10-23 ENCOUNTER — Other Ambulatory Visit: Payer: Self-pay | Admitting: Cardiovascular Disease

## 2017-01-08 ENCOUNTER — Other Ambulatory Visit: Payer: Self-pay | Admitting: Cardiovascular Disease

## 2017-01-23 ENCOUNTER — Other Ambulatory Visit: Payer: Self-pay | Admitting: Cardiovascular Disease

## 2017-01-24 NOTE — Telephone Encounter (Signed)
Pt needs f/u appt with Gollan. Thanks 

## 2017-01-27 NOTE — Telephone Encounter (Signed)
Number was not taking in call,will try again at a later time

## 2017-02-01 NOTE — Telephone Encounter (Signed)
Number was not taking in call,will try again at a later time

## 2017-02-07 NOTE — Telephone Encounter (Signed)
Number was not taking in call,will try again at a later time

## 2017-02-08 ENCOUNTER — Encounter: Payer: Self-pay | Admitting: Cardiovascular Disease

## 2017-02-08 NOTE — Telephone Encounter (Signed)
Unable to contact  Sent letter to patient.

## 2017-02-15 ENCOUNTER — Other Ambulatory Visit: Payer: Self-pay | Admitting: Obstetrics and Gynecology

## 2017-02-18 ENCOUNTER — Other Ambulatory Visit: Payer: Self-pay | Admitting: Obstetrics and Gynecology

## 2017-02-18 ENCOUNTER — Telehealth: Payer: Self-pay

## 2017-02-18 MED ORDER — PAROXETINE HCL 20 MG PO TABS: ORAL_TABLET | ORAL | 0 refills | Status: DC

## 2017-02-18 NOTE — Telephone Encounter (Signed)
Pt calling for refills on Paxil.  Has a few left.  New C# [phone]

## 2017-02-18 NOTE — Telephone Encounter (Signed)
Rx eRxd. RN to notify pt

## 2017-02-18 NOTE — Telephone Encounter (Signed)
Left detailed msg.

## 2017-03-19 ENCOUNTER — Other Ambulatory Visit: Payer: Self-pay | Admitting: Obstetrics and Gynecology

## 2017-04-08 ENCOUNTER — Other Ambulatory Visit: Payer: Self-pay | Admitting: Cardiovascular Disease

## 2017-04-12 ENCOUNTER — Other Ambulatory Visit: Payer: Self-pay | Admitting: Cardiovascular Disease

## 2017-04-17 NOTE — Progress Notes (Signed)
Cardiology Office Note  Date:  04/18/2017   ID:  Mary Christian, DOB 02-Sep-1961, MRN [id number]  PCP:  Jerrol Banana., MD   Chief Complaint  Patient presents with  . other    12 month follow up. Patient states she is doing good. Meds reviewed verbally with patient.     HPI:  Ms Alexie is a 56 year old woman with a history of  hypertension,  anxiety,  previous episode of acute renal failure from dehydration in 2013, urinary tract infection in June 2013,  who presents for  Follow-up of her hypertension  In follow-up she reports doing well She is on amlodipine 5 mg daily, losartan 100 mg daily Does not take HCTZ on a regular basis, only as needed for ankle swelling  Previously had side effects on clonidine, fatigue Blood pressure borderline elevated today, sometimes running high at home No side effects from her medications Denies any leg swelling  No regular exercise program No recent lab work available  EKG on today's visit shows normal sinus rhythm with rate 92 bpm, no significant ST or T-wave changes  Other past medical history Previous  Leg edema has resolved with lower dose amlodipine   previously on Ziac and HCTZ in separate pills.  She was changed to Norvasc   Initially she was on a lower dose, changed to 10 mg. started having leg edema    PMH:   has a past medical history of Abnormal vaginal bleeding; Anxiety; GERD (gastroesophageal reflux disease); Hiatal hernia; Hypertension; and Renal insufficiency.  PSH:    Past Surgical History:  Procedure Laterality Date  . CESAREAN SECTION    . DILATATION & CURETTAGE/HYSTEROSCOPY WITH MYOSURE N/A 03/05/2016   Procedure: DILATATION & CURETTAGE/HYSTEROSCOPY WITH MYOSURE;  Surgeon: Will Bonnet, MD;  Location: [facility] ORS;  Service: Gynecology;  Laterality: N/A;  . DILATION AND CURETTAGE OF UTERUS    . TUBAL LIGATION      Current Outpatient Prescriptions  Medication Sig Dispense Refill  . ALPRAZolam (XANAX)  0.5 MG tablet TAKE 0.5-1 TABLET EVERY DAY AS NEEDED FOR SYMPTOMS 30 tablet 0  . amLODipine (NORVASC) 10 MG tablet Take 1 tablet (10 mg total) by mouth daily. 90 tablet 4  . CALCIUM CITRATE-VITAMIN D PO Take 630 mg by mouth daily.    . cetirizine (ZYRTEC) 10 MG tablet Take 10 mg by mouth daily.    . hydrochlorothiazide (HYDRODIURIL) 25 MG tablet Take 1 tablet (25 mg total) by mouth daily. 90 tablet 3  . losartan (COZAAR) 100 MG tablet Take 1 tablet (100 mg total) by mouth daily. 90 tablet 4  . omeprazole (PRILOSEC) 20 MG capsule Take 20 mg by mouth daily.    Marland Kitchen PARoxetine (PAXIL) 20 MG tablet TAKE 2 TABLETS BY MOUTH EVERY DAY 60 tablet 1  . psyllium (REGULOID) 0.52 G capsule Take 0.52 g by mouth daily.     No current facility-administered medications for this visit.      Allergies:   Patient has no known allergies.   Social History:  The patient  reports that she quit smoking about 31 years ago. Her smoking use included Cigarettes. She has a 3.00 pack-year smoking history. She has never used smokeless tobacco. She reports that she drinks about 0.6 oz of alcohol per week . She reports that she does not use drugs.   Family History:   family history includes Colon cancer in her mother; Heart attack (age of onset: 50) in her father; Heart disease in her father;  Hyperlipidemia in her sister and sister; Hypertension in her brother, father, mother, sister, and sister.    Review of Systems: Review of Systems  Constitutional: Negative.   Respiratory: Negative.   Cardiovascular: Negative.   Gastrointestinal: Negative.   Musculoskeletal: Negative.   Neurological: Negative.   Psychiatric/Behavioral: Negative.   All other systems reviewed and are negative.    PHYSICAL EXAM: VS:  BP (!) 148/66 (BP Location: Left Arm, Patient Position: Sitting, Cuff Size: Normal)   Pulse 92   Ht 5\' 5"  (1.651 m)   Wt 166 lb (75.3 kg)   BMI 27.62 kg/m  , BMI Body mass index is 27.62 kg/m. GEN: Well nourished,  well developed, in no acute distress  HEENT: normal  Neck: no JVD, carotid bruits, or masses Cardiac: RRR; no murmurs, rubs, or gallops,no edema  Respiratory:  clear to auscultation bilaterally, normal work of breathing GI: soft, nontender, nondistended, + BS MS: no deformity or atrophy  Skin: warm and dry, no rash Neuro:  Strength and sensation are intact Psych: euthymic mood, full affect    Recent Labs: No results found for requested labs within last 8760 hours.    Lipid Panel Lab Results  Component Value Date   CHOL 201 (A) 03/26/2014   HDL 52 03/26/2014   LDLCALC 81 03/26/2014   TRIG 342 (A) 03/26/2014      Wt Readings from Last 3 Encounters:  04/18/17 166 lb (75.3 kg)  09/06/14 172 lb (78 kg)  03/05/16 173 lb (78.5 kg)       ASSESSMENT AND PLAN:  Essential hypertension - Plan: EKG 12-Lead If blood pressures running high recommended she try to increase amlodipine up to 10 mg daily Stay on losartan, HCTZ only as needed  Anxiety - Plan: EKG 12-Lead We'll defer to primary care  Leg swelling - Plan: EKG 12-Lead Denies having any leg swelling   Total encounter time more than 15 minutes  Greater than 50% was spent in counseling and coordination of care with the patient    Disposition:   F/U  as needed   Orders Placed This Encounter  Procedures  . EKG 12-Lead     Signed, Esmond Plants, M.D., Ph.D. 04/18/2017  [place], [place]

## 2017-04-18 ENCOUNTER — Other Ambulatory Visit: Payer: Self-pay | Admitting: Obstetrics and Gynecology

## 2017-04-18 ENCOUNTER — Encounter: Payer: Self-pay | Admitting: Cardiovascular Disease

## 2017-04-18 ENCOUNTER — Ambulatory Visit (INDEPENDENT_AMBULATORY_CARE_PROVIDER_SITE_OTHER): Payer: Self-pay | Admitting: Cardiovascular Disease

## 2017-04-18 VITALS — BP 148/66 | HR 92 | Ht 65.0 in | Wt 166.0 lb

## 2017-04-18 DIAGNOSIS — F419 Anxiety disorder, unspecified: Secondary | ICD-10-CM

## 2017-04-18 DIAGNOSIS — M7989 Other specified soft tissue disorders: Secondary | ICD-10-CM

## 2017-04-18 DIAGNOSIS — I10 Essential (primary) hypertension: Secondary | ICD-10-CM

## 2017-04-18 MED ORDER — LOSARTAN POTASSIUM 100 MG PO TABS: 100.0000 mg | ORAL_TABLET | Freq: Every day | ORAL | 4 refills | Status: DC

## 2017-04-18 MED ORDER — AMLODIPINE BESYLATE 10 MG PO TABS: 10.0000 mg | ORAL_TABLET | Freq: Every day | ORAL | 4 refills | Status: DC

## 2017-04-18 NOTE — Patient Instructions (Addendum)
Medication Instructions:   Please increase the amlodipine up to 10 mg if needed Goal top number <140 Goal bottom number <90  Labwork:  No new labs needed  Testing/Procedures:  No further testing at this time   Follow-Up: It was a pleasure seeing you in the office today. Please call us if you have new issues that need to be addressed before your next appt.  [phone]  Your physician wants you to follow-up in: As needed   If you need a refill on your cardiac medications before your next appointment, please call your pharmacy.

## 2017-05-24 ENCOUNTER — Ambulatory Visit: Payer: Self-pay | Admitting: Obstetrics and Gynecology

## 2017-06-09 ENCOUNTER — Other Ambulatory Visit: Payer: Self-pay | Admitting: Obstetrics and Gynecology

## 2017-06-09 NOTE — Telephone Encounter (Signed)
Please advise for refill. Thank you.  

## 2017-06-09 NOTE — Telephone Encounter (Signed)
Faxed to [facility]

## 2017-06-16 ENCOUNTER — Encounter: Payer: Self-pay | Admitting: Obstetrics and Gynecology

## 2017-06-16 ENCOUNTER — Ambulatory Visit (INDEPENDENT_AMBULATORY_CARE_PROVIDER_SITE_OTHER): Payer: Self-pay | Admitting: Obstetrics and Gynecology

## 2017-06-16 VITALS — BP 120/80 | HR 81 | Ht 65.0 in | Wt 164.0 lb

## 2017-06-16 DIAGNOSIS — N951 Menopausal and female climacteric states: Secondary | ICD-10-CM

## 2017-06-16 DIAGNOSIS — F419 Anxiety disorder, unspecified: Secondary | ICD-10-CM

## 2017-06-16 DIAGNOSIS — Z01419 Encounter for gynecological examination (general) (routine) without abnormal findings: Secondary | ICD-10-CM

## 2017-06-16 DIAGNOSIS — Z1231 Encounter for screening mammogram for malignant neoplasm of breast: Secondary | ICD-10-CM

## 2017-06-16 DIAGNOSIS — Z1239 Encounter for other screening for malignant neoplasm of breast: Secondary | ICD-10-CM

## 2017-06-16 MED ORDER — ALPRAZOLAM 0.5 MG PO TABS: ORAL_TABLET | ORAL | 0 refills | Status: DC

## 2017-06-16 MED ORDER — PAROXETINE HCL 20 MG PO TABS: 20.0000 mg | ORAL_TABLET | Freq: Every day | ORAL | 3 refills | Status: DC

## 2017-06-16 NOTE — Progress Notes (Signed)
PCP: Jerrol Banana., MD   Chief Complaint  Patient presents with  . Gynecologic Exam    HPI:      [name] is a 56 y.o. No obstetric history on file. who LMP was Patient's last menstrual period was 06/16/2016., presents today for her annual examination.  Her menses are absent after cervical leio removed last yr with Dr. Glennon Mac. She does not have intermenstrual bleeding.  She does have vasomotor sx. Sx tolerable with paxil use. She was originally on paxil 40 mg daily for anxiety. She tried to wean off earlier this yr and had bad vasomotor sx off it. She then resumed 40 mg dose but did ok with 20 mg dose in terms of hot flashes/nt sweats. She did not have any anxiety sx off paxil and could do without it for those sx. She does take xanax sporadically for episodic anxiety.  Sex activity: single partner, contraception - post menopausal status. She does not have vaginal dryness.  Last Pap: January 13, 2015  Results were: no abnormalities /neg HPV DNA.  Hx of STDs: none  Last mammogram: January 13, 2015  Results were: normal--routine follow-up in 12 months There is no FH of breast cancer. There is a FH of ovarian cancer in her pat aunt. Pt is MyRisk neg 2016. The patient does do self-breast exams.  Colonoscopy: colonoscopy 11 years ago without abnormalities. . Repeat due after 10 years. Pt will call to sched. FH of colon cancer in mother and father.  Tobacco use: The patient denies current or previous tobacco use. Alcohol use: social drinker Exercise: moderately active  She does get adequate calcium and Vitamin D in her diet.   Past Medical History:  Diagnosis Date  . Abnormal vaginal bleeding   . Anxiety   . 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 GERD (gastroesophageal reflux disease)   . Hiatal hernia   . Hypertension   . Renal insufficiency     Past Surgical History:  Procedure Laterality Date  . CESAREAN SECTION    . DILATATION &  CURETTAGE/HYSTEROSCOPY WITH MYOSURE N/A 03/05/2016   Procedure: DILATATION & CURETTAGE/HYSTEROSCOPY WITH MYOSURE;  Surgeon: Will Bonnet, MD;  Location: [facility] ORS;  Service: Gynecology;  Laterality: N/A;  . DILATION AND CURETTAGE OF UTERUS    . TUBAL LIGATION      Family History  Problem Relation Age of Onset  . Hypertension Father   . Heart disease Father        CABG x 3  . Heart attack Father 35  . Hypertension Sister   . Hyperlipidemia Sister   . Hypertension Brother   . Hypertension Sister   . Hyperlipidemia Sister   . Hypertension Mother   . Colon cancer Mother     Social History   Social History  . Marital status: Married    Spouse name: N/A  . Number of children: N/A  . Years of education: N/A   Occupational History  . Not on file.   Social History Main Topics  . Smoking status: Former Smoker    Packs/day: 1.00    Years: 3.00    Types: Cigarettes    Quit date: 05/01/1985  . Smokeless tobacco: Never Used  . Alcohol use 0.6 oz/week    1 Glasses of wine per week  . Drug use: No  . Sexual activity: Yes   Other Topics Concern  . Not on file   Social History Narrative  . No  narrative on file    No outpatient prescriptions have been marked as taking for the 06/16/17 encounter (Office Visit) with Copland, Deirdre Evener, PA-C.      ROS:  Review of Systems  Constitutional: Negative for fatigue, fever and unexpected weight change.  Respiratory: Negative for cough, shortness of breath and wheezing.   Cardiovascular: Negative for chest pain, palpitations and leg swelling.  Gastrointestinal: Negative for blood in stool, constipation, diarrhea, nausea and vomiting.  Endocrine: Negative for cold intolerance, heat intolerance and polyuria.  Genitourinary: Negative for dyspareunia, dysuria, flank pain, frequency, genital sores, hematuria, menstrual problem, pelvic pain, urgency, vaginal bleeding, vaginal discharge and vaginal pain.  Musculoskeletal: Negative for back  pain, joint swelling and myalgias.  Skin: Negative for rash.  Neurological: Negative for dizziness, syncope, light-headedness, numbness and headaches.  Hematological: Negative for adenopathy.  Psychiatric/Behavioral: Negative for agitation, confusion, sleep disturbance and suicidal ideas. The patient is not nervous/anxious.      Objective: BP 120/80   Pulse 81   Ht 5\' 5"  (1.651 m)   Wt 164 lb (74.4 kg)   LMP 06/16/2016   BMI 27.29 kg/m    Physical Exam  Constitutional: She is oriented to person, place, and time. She appears well-developed and well-nourished.  Genitourinary: Vagina normal and uterus normal. There is no rash or tenderness on the right labia. There is no rash or tenderness on the left labia. No erythema or tenderness in the vagina. No vaginal discharge found. Right adnexum does not display mass and does not display tenderness. Left adnexum does not display mass and does not display tenderness. Cervix does not exhibit motion tenderness or polyp. Uterus is not enlarged or tender.  Neck: Normal range of motion. No thyromegaly present.  Cardiovascular: Normal rate, regular rhythm and normal heart sounds.   No murmur heard. Pulmonary/Chest: Effort normal and breath sounds normal. Right breast exhibits no mass, no nipple discharge, no skin change and no tenderness. Left breast exhibits no mass, no nipple discharge, no skin change and no tenderness.  Abdominal: Soft. There is no tenderness. There is no guarding.  Musculoskeletal: Normal range of motion.  Neurological: She is alert and oriented to person, place, and time. No cranial nerve deficit.  Psychiatric: She has a normal mood and affect. Her behavior is normal.  Vitals reviewed.   Assessment/Plan:  Encounter for annual routine gynecological examination  Screening for breast cancer - Pt to sched mammo. - Plan: MM DIGITAL SCREENING BILATERAL  Anxiety - Improved, even off paxil. Rx RF xanax that pt takes sparingly.  Cont paxil for vasomotor sx. - Plan: PARoxetine (PAXIL) 20 MG tablet, ALPRAZolam (XANAX) 0.5 MG tablet  Vasomotor symptoms due to menopause - Improved with paxil. Try 20 mg dose or 10 mg dose. Pt to wean down to lowest effective dose. Rx RF. - Plan: PARoxetine (PAXIL) 20 MG tablet, ALPRAZolam (XANAX) 0.5 MG tablet   Meds ordered this encounter  Medications  . PARoxetine (PAXIL) 20 MG tablet    Sig: Take 1 tablet (20 mg total) by mouth daily.    Dispense:  90 tablet    Refill:  3  . ALPRAZolam (XANAX) 0.5 MG tablet    Sig: TAKE 0.5-1 TABLET BY MOUTH EVERY DAY AS NEEDED FOR SYMPTOMS    Dispense:  30 tablet    Refill:  0            GYN counsel breast self exam, mammography screening, menopause, adequate intake of calcium and vitamin D, diet and exercise  F/U  Return in about 1 year (around 06/16/2018).  Alicia B. Copland, PA-C 06/16/2017 4:49 PM

## 2017-07-08 ENCOUNTER — Other Ambulatory Visit: Payer: Self-pay | Admitting: Obstetrics and Gynecology

## 2017-07-18 ENCOUNTER — Other Ambulatory Visit: Payer: Self-pay | Admitting: Obstetrics and Gynecology

## 2017-07-18 DIAGNOSIS — N951 Menopausal and female climacteric states: Secondary | ICD-10-CM

## 2017-07-18 DIAGNOSIS — F419 Anxiety disorder, unspecified: Secondary | ICD-10-CM

## 2017-07-18 MED ORDER — PAROXETINE HCL 20 MG PO TABS: 20.0000 mg | ORAL_TABLET | Freq: Every day | ORAL | 2 refills | Status: DC

## 2017-07-18 NOTE — Progress Notes (Signed)
[facility]

## 2018-02-27 ENCOUNTER — Encounter: Payer: Self-pay | Admitting: *Deleted

## 2018-03-09 ENCOUNTER — Other Ambulatory Visit: Payer: Self-pay | Admitting: Obstetrics and Gynecology

## 2018-03-09 DIAGNOSIS — N951 Menopausal and female climacteric states: Secondary | ICD-10-CM

## 2018-03-09 DIAGNOSIS — F419 Anxiety disorder, unspecified: Secondary | ICD-10-CM

## 2018-04-04 ENCOUNTER — Ambulatory Visit (INDEPENDENT_AMBULATORY_CARE_PROVIDER_SITE_OTHER): Payer: Self-pay | Admitting: General Surgery

## 2018-04-04 ENCOUNTER — Encounter: Payer: Self-pay | Admitting: General Surgery

## 2018-04-04 VITALS — BP 102/58 | HR 75 | Resp 16 | Ht 65.0 in | Wt 166.0 lb

## 2018-04-04 DIAGNOSIS — Z1211 Encounter for screening for malignant neoplasm of colon: Secondary | ICD-10-CM

## 2018-04-04 NOTE — Patient Instructions (Signed)
Colonoscopy, Adult A colonoscopy is an exam to look at the entire large intestine. During the exam, a lubricated, bendable tube is inserted into the anus and then passed into the rectum, colon, and other parts of the large intestine. A colonoscopy is often done as a part of normal colorectal screening or in response to certain symptoms, such as anemia, persistent diarrhea, abdominal pain, and blood in the stool. The exam can help screen for and diagnose medical problems, including:  Tumors.  Polyps.  Inflammation.  Areas of bleeding.  Tell a health care provider about:  Any allergies you have.  All medicines you are taking, including vitamins, herbs, eye drops, creams, and over-the-counter medicines.  Any problems you or family members have had with anesthetic medicines.  Any blood disorders you have.  Any surgeries you have had.  Any medical conditions you have.  Any problems you have had passing stool. What are the risks? Generally, this is a safe procedure. However, problems may occur, including:  Bleeding.  A tear in the intestine.  A reaction to medicines given during the exam.  Infection (rare).  What happens before the procedure? Eating and drinking restrictions Follow instructions from your health care provider about eating and drinking, which may include:  A few days before the procedure - follow a low-fiber diet. Avoid nuts, seeds, dried fruit, raw fruits, and vegetables.  1-3 days before the procedure - follow a clear liquid diet. Drink only clear liquids, such as clear broth or bouillon, black coffee or tea, clear juice, clear soft drinks or sports drinks, gelatin dessert, and popsicles. Avoid any liquids that contain red or purple dye.  On the day of the procedure - do not eat or drink anything during the 2 hours before the procedure, or within the time period that your health care provider recommends.  Bowel prep If you were prescribed an oral bowel prep  to clean out your colon:  Take it as told by your health care provider. Starting the day before your procedure, you will need to drink a large amount of medicated liquid. The liquid will cause you to have multiple loose stools until your stool is almost clear or light green.  If your skin or anus gets irritated from diarrhea, you may use these to relieve the irritation: ? Medicated wipes, such as adult wet wipes with aloe and vitamin E. ? A skin soothing-product like petroleum jelly.  If you vomit while drinking the bowel prep, take a break for up to 60 minutes and then begin the bowel prep again. If vomiting continues and you cannot take the bowel prep without vomiting, call your health care provider.  General instructions  Ask your health care provider about changing or stopping your regular medicines. This is especially important if you are taking diabetes medicines or blood thinners.  Plan to have someone take you home from the hospital or clinic. What happens during the procedure?  An IV tube may be inserted into one of your veins.  You will be given medicine to help you relax (sedative).  To reduce your risk of infection: ? Your health care team will wash or sanitize their hands. ? Your anal area will be washed with soap.  You will be asked to lie on your side with your knees bent.  Your health care provider will lubricate a long, thin, flexible tube. The tube will have a camera and a light on the end.  The tube will be inserted into your   anus.  The tube will be gently eased through your rectum and colon.  Air will be delivered into your colon to keep it open. You may feel some pressure or cramping.  The camera will be used to take images during the procedure.  A small tissue sample may be removed from your body to be examined under a microscope (biopsy). If any potential problems are found, the tissue will be sent to a lab for testing.  If small polyps are found, your  health care provider may remove them and have them checked for cancer cells.  The tube that was inserted into your anus will be slowly removed. The procedure may vary among health care providers and hospitals. What happens after the procedure?  Your blood pressure, heart rate, breathing rate, and blood oxygen level will be monitored until the medicines you were given have worn off.  Do not drive for 24 hours after the exam.  You may have a small amount of blood in your stool.  You may pass gas and have mild abdominal cramping or bloating due to the air that was used to inflate your colon during the exam.  It is up to you to get the results of your procedure. Ask your health care provider, or the department performing the procedure, when your results will be ready. This information is not intended to replace advice given to you by your health care provider. Make sure you discuss any questions you have with your health care provider. Document Released: 09/03/2000 Document Revised: 07/07/2016 Document Reviewed: 11/18/2015 Elsevier Interactive Patient Education  2018 Elsevier Inc.  

## 2018-04-04 NOTE — Progress Notes (Signed)
Patient ID: Mary Christian, female   DOB: [birth year], 57 y.o.   MRN: [id number]  Chief Complaint  Patient presents with  . Colonoscopy    HPI SUMIKO CEASAR is a 57 y.o. female.  Who presents for a colonoscopy discussion. The last colonoscopy was completed in 2007 . Denies any gastrointestinal issues. Bowels move regular and no bleeding noted.  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  HPI  Past Medical History:  Diagnosis Date  . Abnormal vaginal bleeding   . Anxiety   . 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 GERD (gastroesophageal reflux disease)   . Hiatal hernia   . Hypertension   . Renal insufficiency     Past Surgical History:  Procedure Laterality Date  . CESAREAN SECTION    . COLONOSCOPY  08/04/2006   Dr Bary Castilla  . DILATATION & CURETTAGE/HYSTEROSCOPY WITH MYOSURE N/A 03/05/2016   Procedure: DILATATION & CURETTAGE/HYSTEROSCOPY WITH MYOSURE;  Surgeon: Will Bonnet, MD;  Location: [facility] ORS;  Service: Gynecology;  Laterality: N/A;  . DILATION AND CURETTAGE OF UTERUS    . TUBAL LIGATION    . UPPER GI ENDOSCOPY  08/04/2006   Dr Bary Castilla    Family History  Problem Relation Age of Onset  . Hypertension Father   . Heart disease Father        CABG x 3  . Heart attack Father 29  . Colon cancer Father   . Liver cancer Father   . Hypertension Sister   . Hyperlipidemia Sister   . Hypertension Brother   . Hypertension Sister   . Hyperlipidemia Sister   . Hypertension Mother   . Colon cancer Mother 6       with mets.    Social History Social History   Tobacco Use  . Smoking status: Former Smoker    Packs/day: 1.00    Years: 3.00    Pack years: 3.00    Types: Cigarettes    Last attempt to quit: 05/01/1985    Years since quitting: 32.9  . Smokeless tobacco: Never Used  Substance Use Topics  . Alcohol use: Yes    Alcohol/week: 0.6 oz    Types: 1 Glasses of wine per week  . Drug use: No    No Known Allergies  Current Outpatient Medications  Medication  Sig Dispense Refill  . ALPRAZolam (XANAX) 0.5 MG tablet TAKE 0.5 TO 1 TABLET BY MOUTH EVERY DAY AS NEEDED FOR SYMPTOMS 30 tablet 0  . amLODipine (NORVASC) 10 MG tablet Take 1 tablet (10 mg total) by mouth daily. 90 tablet 4  . CALCIUM CITRATE-VITAMIN D PO Take 630 mg by mouth daily.    . cetirizine (ZYRTEC) 10 MG tablet Take 10 mg by mouth daily.    Marland Kitchen losartan (COZAAR) 100 MG tablet Take 1 tablet (100 mg total) by mouth daily. 90 tablet 4  . omeprazole (PRILOSEC) 20 MG capsule Take 20 mg by mouth daily.    Marland Kitchen PARoxetine (PAXIL) 20 MG tablet Take 1 tablet (20 mg total) by mouth daily. 90 tablet 2   No current facility-administered medications for this visit.     Review of Systems Review of Systems  Constitutional: Negative.   Respiratory: Negative.   Cardiovascular: Negative.     Blood pressure (!) 102/58, pulse 75, resp. rate 16, height 5\' 5"  (1.651 m), weight 166 lb (75.3 kg), last menstrual period 06/16/2016, SpO2 98 %.  Physical Exam Physical Exam  Constitutional: She is oriented to person, place,  and time. She appears well-developed and well-nourished.  HENT:  Mouth/Throat: Oropharynx is clear and moist.  Eyes: Conjunctivae are normal.  Neck: Neck supple.  Cardiovascular: Normal rate, regular rhythm and normal heart sounds.  Pulmonary/Chest: Effort normal and breath sounds normal.  Neurological: She is alert and oriented to person, place, and time.  Skin: Skin is warm and dry.  Psychiatric: Her behavior is normal.    Data Reviewed Hyperplastic polyps were identified in the rectum at the time of the August 04, 2006 screening exam.  Assessment    Candidate for screening colonoscopy.    Plan    Colonoscopy with possible biopsy/polypectomy prn: Information regarding the procedure, including its potential risks and complications (including but not limited to perforation of the bowel, which may require emergency surgery to repair, and bleeding) was verbally given to the  patient. Educational information regarding lower intestinal endoscopy was given to the patient. Written instructions for how to complete the bowel prep using Miralax were provided. The importance of drinking ample fluids to avoid dehydration as a result of the prep emphasized.      HPI, Physical Exam, Assessment and Plan have been scribed under the direction and in the presence of Robert Bellow, MD. Karie Fetch, RN  I have completed the exam and reviewed the above documentation for accuracy and completeness.  I agree with the above.  Haematologist has been used and any errors in dictation or transcription are unintentional.  Hervey Ard, M.D., F.A.C.S.  Forest Gleason Severus Brodzinski 04/04/2018, 8:33 PM

## 2018-04-22 ENCOUNTER — Encounter: Payer: Self-pay | Admitting: General Surgery

## 2018-04-24 ENCOUNTER — Telehealth: Payer: Self-pay

## 2018-04-24 NOTE — Telephone Encounter (Signed)
Call to patient to inform her of self pay charges for her colonoscopy. She states that she now has insurance. She will bring by her insurance card and schedule her colonoscopy at that time. She is out of town on vacation and will do this once she gets back.

## 2018-05-02 ENCOUNTER — Telehealth: Payer: Self-pay

## 2018-05-02 NOTE — Telephone Encounter (Signed)
Patient is ready to schedule surgery.  She now has insurance but it has a six month waiting period.  Please call her to discuss.  [phone].

## 2018-05-03 NOTE — Telephone Encounter (Signed)
Message left for patient to call back to schedule colonoscopy.

## 2018-05-03 NOTE — Telephone Encounter (Signed)
Called patient and spoke with her and she would like to wait until her 6 month waiting period is over for her insurance. We will call her in January 2020 to schedule her Colonoscopy.

## 2018-05-09 ENCOUNTER — Other Ambulatory Visit: Payer: Self-pay | Admitting: Cardiovascular Disease

## 2018-07-26 ENCOUNTER — Other Ambulatory Visit: Payer: Self-pay | Admitting: Cardiovascular Disease

## 2018-08-21 ENCOUNTER — Telehealth: Payer: Self-pay | Admitting: Cardiovascular Disease

## 2018-08-21 ENCOUNTER — Other Ambulatory Visit: Payer: Self-pay | Admitting: Cardiovascular Disease

## 2018-08-21 NOTE — Telephone Encounter (Signed)
-----   Message from Janan Ridge, [place] sent at 08/21/2018  8:49 AM EST ----- Regarding: Appointment for refills Patient needs an appointment for further refills.  If patient does not want an appointment please make them aware to contact PCP for refills.   Thank you!

## 2018-08-21 NOTE — Telephone Encounter (Signed)
Lmov for patient to call back and schedule  °

## 2018-08-22 NOTE — Telephone Encounter (Signed)
Lmov for patient to call back and schedule  °

## 2018-09-05 ENCOUNTER — Telehealth: Payer: Self-pay

## 2018-09-05 NOTE — Telephone Encounter (Signed)
Call to patient to see about her scheduling her Colonoscopy with Dr Bary Castilla for January. No office visit needed if no change in med history or medications.

## 2018-09-08 ENCOUNTER — Other Ambulatory Visit: Payer: Self-pay | Admitting: Obstetrics and Gynecology

## 2018-09-08 DIAGNOSIS — N951 Menopausal and female climacteric states: Secondary | ICD-10-CM

## 2018-09-08 DIAGNOSIS — F419 Anxiety disorder, unspecified: Secondary | ICD-10-CM

## 2018-09-08 NOTE — Telephone Encounter (Signed)
Please advise 

## 2018-09-13 ENCOUNTER — Other Ambulatory Visit: Payer: Self-pay | Admitting: Cardiovascular Disease

## 2018-09-25 ENCOUNTER — Ambulatory Visit (INDEPENDENT_AMBULATORY_CARE_PROVIDER_SITE_OTHER): Payer: No Typology Code available for payment source | Admitting: Obstetrics and Gynecology

## 2018-09-25 ENCOUNTER — Encounter: Payer: Self-pay | Admitting: Obstetrics and Gynecology

## 2018-09-25 ENCOUNTER — Other Ambulatory Visit (HOSPITAL_COMMUNITY)
Admission: RE | Admit: 2018-09-25 | Discharge: 2018-09-25 | Disposition: A | Discharge status: Home / Self Care | Payer: 59 | Source: Ambulatory Visit | Attending: Obstetrics and Gynecology | Admitting: Obstetrics and Gynecology

## 2018-09-25 VITALS — BP 152/70 | HR 93 | Ht 65.0 in | Wt 174.0 lb

## 2018-09-25 DIAGNOSIS — Z1151 Encounter for screening for human papillomavirus (HPV): Secondary | ICD-10-CM

## 2018-09-25 DIAGNOSIS — Z01419 Encounter for gynecological examination (general) (routine) without abnormal findings: Secondary | ICD-10-CM | POA: Diagnosis not present

## 2018-09-25 DIAGNOSIS — F419 Anxiety disorder, unspecified: Secondary | ICD-10-CM

## 2018-09-25 DIAGNOSIS — Z0000 Encounter for general adult medical examination without abnormal findings: Secondary | ICD-10-CM

## 2018-09-25 DIAGNOSIS — Z124 Encounter for screening for malignant neoplasm of cervix: Secondary | ICD-10-CM | POA: Diagnosis not present

## 2018-09-25 DIAGNOSIS — Z13220 Encounter for screening for lipoid disorders: Secondary | ICD-10-CM

## 2018-09-25 DIAGNOSIS — Z800 Family history of malignant neoplasm of digestive organs: Secondary | ICD-10-CM

## 2018-09-25 DIAGNOSIS — Z1239 Encounter for other screening for malignant neoplasm of breast: Secondary | ICD-10-CM

## 2018-09-25 DIAGNOSIS — Z131 Encounter for screening for diabetes mellitus: Secondary | ICD-10-CM

## 2018-09-25 DIAGNOSIS — N951 Menopausal and female climacteric states: Secondary | ICD-10-CM

## 2018-09-25 DIAGNOSIS — Z1211 Encounter for screening for malignant neoplasm of colon: Secondary | ICD-10-CM

## 2018-09-25 MED ORDER — ALPRAZOLAM 0.5 MG PO TABS: ORAL_TABLET | ORAL | 0 refills | Status: DC

## 2018-09-25 MED ORDER — PAROXETINE HCL 20 MG PO TABS: 20.0000 mg | ORAL_TABLET | Freq: Every day | ORAL | 3 refills | Status: DC

## 2018-09-25 NOTE — Patient Instructions (Signed)
I value your feedback and entrusting us with your care. If you get a [facility] patient survey, I would appreciate you taking the time to let us know about your experience today. Thank you!  [facility]: [phone]  [facility]: [phone]  

## 2018-09-25 NOTE — Progress Notes (Signed)
Cardiology Office Note  Date:  09/26/2018   ID:  Mary Christian, DOB [birth year], MRN [id number]  PCP:  Jerrol Banana., MD   Chief Complaint  Patient presents with  . other    12 mo follow up. Questions regarding Losartan.Medications reviewed verbally.     HPI:  [name] is a 58 year old woman with a history of  hypertension,  anxiety,  previous episode of acute renal failure from dehydration in 2013, urinary tract infection in June 2013,  Smoked for 2 years who presents for  Follow-up of her hypertension  In follow-up she reports doing well  on amlodipine 5 mg daily, losartan 100 mg daily Blood pressure doing relatively well Does not take HCTZ  Denies any significant chest pain or shortness of breath on exertion Denies any leg swelling on amlodipine  No regular exercise program  Lab work reviewed with her in detail  EKG personally reviewed by myself on todays visit Shows normal sinus rhythm rate 73 bpm no significant ST or T wave changes  Other past medical history Previous  Leg edema has resolved with lower dose amlodipine   previously on Ziac and HCTZ in separate pills.  She was changed to Norvasc   Initially she was on a lower dose, changed to 10 mg. started having leg edema    PMH:   has a past medical history of Abnormal vaginal bleeding, Anxiety, Cervical leiomyoma (02/2016), GERD (gastroesophageal reflux disease), Hiatal hernia, Hypertension, and Renal insufficiency.  PSH:    Past Surgical History:  Procedure Laterality Date  . CESAREAN SECTION    . COLONOSCOPY  08/04/2006   Dr Bary Castilla  . DILATATION & CURETTAGE/HYSTEROSCOPY WITH MYOSURE N/A 03/05/2016   Procedure: DILATATION & CURETTAGE/HYSTEROSCOPY WITH MYOSURE;  Surgeon: Will Bonnet, MD;  Location: [facility] ORS;  Service: Gynecology;  Laterality: N/A;  . DILATION AND CURETTAGE OF UTERUS    . TUBAL LIGATION    . UPPER GI ENDOSCOPY  08/04/2006   Dr Bary Castilla    Current Outpatient Medications   Medication Sig Dispense Refill  . ALPRAZolam (XANAX) 0.5 MG tablet TAKE 0.5-1 TABLET BY MOUTH DAILY AS NEEDED FOR SYMPTOMS 30 tablet 0  . amLODipine (NORVASC) 10 MG tablet TAKE 1 TABLET BY MOUTH EVERY DAY 30 tablet 0  . CALCIUM CITRATE-VITAMIN D PO Take 630 mg by mouth daily.    . cetirizine (ZYRTEC) 10 MG tablet Take 10 mg by mouth daily.    Marland Kitchen losartan (COZAAR) 100 MG tablet TAKE 1 TABLET BY MOUTH EVERY DAY 90 tablet 1  . omeprazole (PRILOSEC) 20 MG capsule Take 20 mg by mouth daily.    Marland Kitchen PARoxetine (PAXIL) 20 MG tablet Take 1 tablet (20 mg total) by mouth daily. 90 tablet 3   No current facility-administered medications for this visit.      Allergies:   Patient has no known allergies.   Social History:  The patient  reports that she quit smoking about 33 years ago. Her smoking use included cigarettes. She has a 3.00 pack-year smoking history. She has never used smokeless tobacco. She reports current alcohol use of about 1.0 standard drinks of alcohol per week. She reports that she does not use drugs.   Family History:   family history includes Cancer in her paternal aunt; Colon cancer in her father and paternal grandfather; Colon cancer (age of onset: 7) in her mother; Heart Problems in her maternal grandfather; Heart attack (age of onset: 71) in her father; Heart disease in her  father; Hyperlipidemia in her sister and sister; Hypertension in her brother, father, mother, sister, and sister; Liver cancer in her father; Skin cancer in her sister.    Review of Systems: Review of Systems  Constitutional: Negative.   Respiratory: Negative.   Cardiovascular: Negative.   Gastrointestinal: Negative.   Musculoskeletal: Negative.   Neurological: Negative.   Psychiatric/Behavioral: Negative.   All other systems reviewed and are negative.    PHYSICAL EXAM: VS:  BP (!) 142/80 (BP Location: Left Arm, Patient Position: Sitting, Cuff Size: Normal)   Pulse 73   Ht 5\' 5"  (1.651 m)   Wt 173 lb  (78.5 kg)   LMP 06/16/2016   BMI 28.79 kg/m  , BMI Body mass index is 28.79 kg/m. Constitutional:  oriented to person, place, and time. No distress.  HENT:  Head: Grossly normal Eyes:  no discharge. No scleral icterus.  Neck: No JVD, no carotid bruits  Cardiovascular: Regular rate and rhythm, no murmurs appreciated Pulmonary/Chest: Clear to auscultation bilaterally, no wheezes or rails Abdominal: Soft.  no distension.  no tenderness.  Musculoskeletal: Normal range of motion Neurological:  normal muscle tone. Coordination normal. No atrophy Skin: Skin warm and dry Psychiatric: normal affect, pleasant   Recent Labs: No results found for requested labs within last 8760 hours.    Lipid Panel Lab Results  Component Value Date   CHOL 201 (A) 03/26/2014   HDL 52 03/26/2014   LDLCALC 81 03/26/2014   TRIG 342 (A) 03/26/2014      Wt Readings from Last 3 Encounters:  09/26/18 173 lb (78.5 kg)  09/25/18 174 lb (78.9 kg)  04/04/18 166 lb (75.3 kg)       ASSESSMENT AND PLAN:  Essential hypertension - Plan: EKG 12-Lead Blood pressure mildly elevated on today's visit but she reports it is well controlled at home No changes made, recommended she monitor pressures and call us if it continues to run greater than 140  Anxiety - Plan: EKG 12-Lead We'll defer to primary care Reports she is doing relatively well  Leg swelling - Plan: EKG 12-Lead Denies having any leg swelling No side effects from amlodipine   Total encounter time more than 15 minutes  Greater than 50% was spent in counseling and coordination of care with the patient   Disposition:   F/U  as needed She can obtain refills through primary care   Orders Placed This Encounter  Procedures  . EKG 12-Lead     Signed, Esmond Plants, M.D., Ph.D. 09/26/2018  [place], [place]

## 2018-09-25 NOTE — Progress Notes (Signed)
PCP: Jerrol Banana., MD   Chief Complaint  Patient presents with  . Gynecologic Exam    HPI:      [name] is a 58 y.o. No obstetric history on file. who LMP was Patient's last menstrual period was 06/16/2016., presents today for her annual examination.  Her menses are absent due to menopause. Irreg bleeding resolved after endocervical leio removed 2017 with Dr. Glennon Mac. She does not have intermenstrual bleeding.  She does have vasomotor sx. Sx tolerable with paxil 20 mg use. She does take xanax sporadically for episodic anxiety. Needs Rx RF.  Sex activity: single partner, contraception - post menopausal status. She does not have vaginal dryness.  Last Pap: January 13, 2015  Results were: no abnormalities /neg HPV DNA.  Hx of STDs: none  Last mammogram: January 13, 2015  Results were: normal--routine follow-up in 12 months There is no FH of breast cancer. There is a FH of ovarian cancer in her pat aunt. FH colon cancer in both her parents.  Pt is MyRisk neg 2016. The patient does do self-breast exams.  Colonoscopy: colonoscopy 12 years ago without abnormalities. Repeat due after 10 years. Pt has appt 1/20. FH of colon cancer in mother and father.  Tobacco use: The patient denies current or previous tobacco use. Alcohol use: social drinker Exercise: moderately active  She does get adequate calcium and Vitamin D in her diet.  Pt is past due for fasting labs.  Past Medical History:  Diagnosis Date  . Abnormal vaginal bleeding   . Anxiety   . 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 GERD (gastroesophageal reflux disease)   . Hiatal hernia   . Hypertension   . Renal insufficiency     Past Surgical History:  Procedure Laterality Date  . CESAREAN SECTION    . COLONOSCOPY  08/04/2006   Dr Bary Castilla  . DILATATION & CURETTAGE/HYSTEROSCOPY WITH MYOSURE N/A 03/05/2016   Procedure: DILATATION & CURETTAGE/HYSTEROSCOPY WITH MYOSURE;  Surgeon: Will Bonnet, MD;   Location: [facility] ORS;  Service: Gynecology;  Laterality: N/A;  . DILATION AND CURETTAGE OF UTERUS    . TUBAL LIGATION    . UPPER GI ENDOSCOPY  08/04/2006   Dr Bary Castilla    Family History  Problem Relation Age of Onset  . Hypertension Father   . Heart disease Father        CABG x 3  . Heart attack Father 58  . Colon cancer Father   . Liver cancer Father   . Hypertension Sister   . Hyperlipidemia Sister   . Skin cancer Sister        96s  . Hypertension Brother   . Hypertension Sister   . Hyperlipidemia Sister   . Hypertension Mother   . Colon cancer Mother 4       with mets.  . Heart Problems Maternal Grandfather   . Colon cancer Paternal Grandfather   . Cancer Paternal Aunt        ovarian    Social History   Socioeconomic History  . Marital status: Married    Spouse name: Not on file  . Number of children: Not on file  . Years of education: Not on file  . Highest education level: Not on file  Occupational History  . Not on file  Social Needs  . Financial resource strain: Not on file  . Food insecurity:    Worry: Not on file    Inability: Not  on file  . Transportation needs:    Medical: Not on file    Non-medical: Not on file  Tobacco Use  . Smoking status: Former Smoker    Packs/day: 1.00    Years: 3.00    Pack years: 3.00    Types: Cigarettes    Last attempt to quit: 05/01/1985    Years since quitting: 33.4  . Smokeless tobacco: Never Used  Substance and Sexual Activity  . Alcohol use: Yes    Alcohol/week: 1.0 standard drinks    Types: 1 Glasses of wine per week  . Drug use: No  . Sexual activity: Yes    Birth control/protection: Post-menopausal  Lifestyle  . Physical activity:    Days per week: Not on file    Minutes per session: Not on file  . Stress: Not on file  Relationships  . Social connections:    Talks on phone: Not on file    Gets together: Not on file    Attends religious service: Not on file    Active member of club or organization:  Not on file    Attends meetings of clubs or organizations: Not on file    Relationship status: Not on file  . Intimate partner violence:    Fear of current or ex partner: Not on file    Emotionally abused: Not on file    Physically abused: Not on file    Forced sexual activity: Not on file  Other Topics Concern  . Not on file  Social History Narrative  . Not on file    Current Meds  Medication Sig  . ALPRAZolam (XANAX) 0.5 MG tablet TAKE 0.5-1 TABLET BY MOUTH DAILY AS NEEDED FOR SYMPTOMS  . amLODipine (NORVASC) 10 MG tablet TAKE 1 TABLET BY MOUTH EVERY DAY  . CALCIUM CITRATE-VITAMIN D PO Take 630 mg by mouth daily.  . cetirizine (ZYRTEC) 10 MG tablet Take 10 mg by mouth daily.  Marland Kitchen losartan (COZAAR) 100 MG tablet TAKE 1 TABLET BY MOUTH EVERY DAY  . omeprazole (PRILOSEC) 20 MG capsule Take 20 mg by mouth daily.  Marland Kitchen PARoxetine (PAXIL) 20 MG tablet Take 1 tablet (20 mg total) by mouth daily.  . [DISCONTINUED] ALPRAZolam (XANAX) 0.5 MG tablet TAKE 0.5-1 TABLET BY MOUTH DAILY AS NEEDED FOR SYMPTOMS  . [DISCONTINUED] PARoxetine (PAXIL) 20 MG tablet Take 1 tablet (20 mg total) by mouth daily.      ROS:  Review of Systems  Constitutional: Negative for fatigue, fever and unexpected weight change.  Respiratory: Negative for cough, shortness of breath and wheezing.   Cardiovascular: Negative for chest pain, palpitations and leg swelling.  Gastrointestinal: Negative for blood in stool, constipation, diarrhea, nausea and vomiting.  Endocrine: Negative for cold intolerance, heat intolerance and polyuria.  Genitourinary: Negative for dyspareunia, dysuria, flank pain, frequency, genital sores, hematuria, menstrual problem, pelvic pain, urgency, vaginal bleeding, vaginal discharge and vaginal pain.  Musculoskeletal: Negative for back pain, joint swelling and myalgias.  Skin: Negative for rash.  Neurological: Negative for dizziness, syncope, light-headedness, numbness and headaches.    Hematological: Negative for adenopathy.  Psychiatric/Behavioral: Positive for agitation. Negative for confusion, sleep disturbance and suicidal ideas. The patient is not nervous/anxious.      Objective: BP (!) 152/70   Pulse 93   Ht 5\' 5"  (1.651 m)   Wt 174 lb (78.9 kg)   LMP 06/16/2016   BMI 28.96 kg/m    Physical Exam Constitutional:      Appearance: She is well-developed.  Genitourinary:     Vulva, vagina, uterus, right adnexa and left adnexa normal.     No vulval lesion or tenderness noted.     No vaginal discharge, erythema or tenderness.     No cervical motion tenderness or polyp.     Uterus is not enlarged or tender.     No right or left adnexal mass present.     Right adnexa not tender.     Left adnexa not tender.  Neck:     Musculoskeletal: Normal range of motion.     Thyroid: No thyromegaly.  Cardiovascular:     Rate and Rhythm: Normal rate and regular rhythm.     Heart sounds: Normal heart sounds. No murmur.  Pulmonary:     Effort: Pulmonary effort is normal.     Breath sounds: Normal breath sounds.  Chest:     Breasts:        Right: No mass, nipple discharge, skin change or tenderness.        Left: No mass, nipple discharge, skin change or tenderness.  Abdominal:     Palpations: Abdomen is soft.     Tenderness: There is no abdominal tenderness. There is no guarding.  Musculoskeletal: Normal range of motion.  Neurological:     Mental Status: She is alert and oriented to person, place, and time.     Cranial Nerves: No cranial nerve deficit.  Psychiatric:        Behavior: Behavior normal.  Vitals signs reviewed.     Assessment/Plan:  Encounter for annual routine gynecological examination  Cervical cancer screening - Plan: Cytology - PAP  Screening for HPV (human papillomavirus) - Plan: Cytology - PAP  Screening for breast cancer - Pt to sched mammo - Plan: MM 3D SCREEN BREAST BILATERAL  Vasomotor symptoms due to menopause - Rx RF paxil for sx.  F/u prn - Plan: PARoxetine (PAXIL) 20 MG tablet  Anxiety - Rx RF xanax prn, use sparingly. - Plan: ALPRAZolam (XANAX) 0.5 MG tablet, PARoxetine (PAXIL) 20 MG tablet  Screening for colon cancer - Pt has colonoscopy sched for this month.   Family history of colon cancer - Pt is MyRisk neg.   Blood tests for routine general physical examination - Plan: Comprehensive metabolic panel, Lipid panel, Hemoglobin A1c  Screening cholesterol level - Plan: Lipid panel  Screening for diabetes mellitus - Plan: Hemoglobin A1c   Meds ordered this encounter  Medications  . ALPRAZolam (XANAX) 0.5 MG tablet    Sig: TAKE 0.5-1 TABLET BY MOUTH DAILY AS NEEDED FOR SYMPTOMS    Dispense:  30 tablet    Refill:  0    Order Specific Question:   Supervising Provider    Answer:   Gae Dry [id number]  . PARoxetine (PAXIL) 20 MG tablet    Sig: Take 1 tablet (20 mg total) by mouth daily.    Dispense:  90 tablet    Refill:  3    Order Specific Question:   Supervising Provider    Answer:   Gae Dry [[id number]]            GYN counsel breast self exam, mammography screening, menopause, adequate intake of calcium and vitamin D, diet and exercise    F/U  Return in about 1 year (around 09/26/2019).  Tauni Sanks B. Kyndal Heringer, PA-C 09/25/2018 2:24 PM

## 2018-09-26 ENCOUNTER — Ambulatory Visit (INDEPENDENT_AMBULATORY_CARE_PROVIDER_SITE_OTHER): Payer: 59 | Admitting: Cardiovascular Disease

## 2018-09-26 ENCOUNTER — Encounter: Payer: Self-pay | Admitting: Cardiovascular Disease

## 2018-09-26 VITALS — BP 142/80 | HR 73 | Ht 65.0 in | Wt 173.0 lb

## 2018-09-26 DIAGNOSIS — F419 Anxiety disorder, unspecified: Secondary | ICD-10-CM

## 2018-09-26 DIAGNOSIS — I10 Essential (primary) hypertension: Secondary | ICD-10-CM

## 2018-09-26 DIAGNOSIS — M7989 Other specified soft tissue disorders: Secondary | ICD-10-CM

## 2018-09-26 MED ORDER — AMLODIPINE BESYLATE 10 MG PO TABS: 10.0000 mg | ORAL_TABLET | Freq: Every day | ORAL | 4 refills | Status: DC

## 2018-09-26 MED ORDER — LOSARTAN POTASSIUM 100 MG PO TABS: 100.0000 mg | ORAL_TABLET | Freq: Every day | ORAL | 4 refills | Status: DC

## 2018-09-26 NOTE — Patient Instructions (Addendum)
Call if you would ever like a CT coronary calcium score [place], $150   Medication Instructions:  No changes  If you need a refill on your cardiac medications before your next appointment, please call your pharmacy.   Lab work: No new labs needed  If you have labs (blood work) drawn today and your tests are completely normal, you will receive your results only by: Marland Kitchen MyChart Message (if you have MyChart) OR . A paper copy in the mail If you have any lab test that is abnormal or we need to change your treatment, we will call you to review the results.   Testing/Procedures: No new testing needed   Follow-Up: At [facility], you and your health needs are our priority.  As part of our continuing mission to provide you with exceptional heart care, we have created designated Provider Care Teams.  These Care Teams include your primary Cardiologist (physician) and Advanced Practice Providers (APPs -  Physician Assistants and Nurse Practitioners) who all work together to provide you with the care you need, when you need it.  . You will need a follow up appointment as needed  . Providers on your designated Care Team:   . Murray Hodgkins, NP . Christell Faith, PA-C . Marrianne Mood, PA-C  Any Other Special Instructions Will Be Listed Below (If Applicable).  For educational health videos Log in to : [email or web address] Or : [email or web address], password : [email or web address]

## 2018-09-27 LAB — CYTOLOGY - PAP
DIAGNOSIS: NEGATIVE
HPV: NOT DETECTED

## 2018-10-03 ENCOUNTER — Other Ambulatory Visit: Payer: No Typology Code available for payment source

## 2018-10-03 DIAGNOSIS — Z0000 Encounter for general adult medical examination without abnormal findings: Secondary | ICD-10-CM

## 2018-10-03 DIAGNOSIS — Z131 Encounter for screening for diabetes mellitus: Secondary | ICD-10-CM

## 2018-10-03 DIAGNOSIS — Z13220 Encounter for screening for lipoid disorders: Secondary | ICD-10-CM

## 2018-10-04 LAB — COMPREHENSIVE METABOLIC PANEL
ALT: 31 IU/L (ref 0–32)
AST: 28 IU/L (ref 0–40)
Albumin/Globulin Ratio: 1.6 (ref 1.2–2.2)
Albumin: 4.6 g/dL (ref 3.5–5.5)
Alkaline Phosphatase: 80 IU/L (ref 39–117)
BUN/Creatinine Ratio: 14 (ref 9–23)
BUN: 15 mg/dL (ref 6–24)
Bilirubin Total: 0.4 mg/dL (ref 0.0–1.2)
CO2: 20 mmol/L (ref 20–29)
Calcium: 10.1 mg/dL (ref 8.7–10.2)
Chloride: 99 mmol/L (ref 96–106)
Creatinine, Ser: 1.06 mg/dL — ABNORMAL HIGH (ref 0.57–1.00)
GFR calc non Af Amer: 59 mL/min/{1.73_m2} — ABNORMAL LOW (ref >59)
GFR, EST AFRICAN AMERICAN: 68 mL/min/{1.73_m2} (ref >59)
Globulin, Total: 2.8 g/dL (ref 1.5–4.5)
Glucose: 85 mg/dL (ref 65–99)
Potassium: 4.5 mmol/L (ref 3.5–5.2)
Sodium: 141 mmol/L (ref 134–144)
TOTAL PROTEIN: 7.4 g/dL (ref 6.0–8.5)

## 2018-10-04 LAB — LIPID PANEL
Chol/HDL Ratio: 4 ratio (ref 0.0–4.4)
Cholesterol, Total: 249 mg/dL — ABNORMAL HIGH (ref 100–199)
HDL: 63 mg/dL (ref >39)
LDL Calculated: 144 mg/dL — ABNORMAL HIGH (ref 0–99)
Triglycerides: 209 mg/dL — ABNORMAL HIGH (ref 0–149)
VLDL Cholesterol Cal: 42 mg/dL — ABNORMAL HIGH (ref 5–40)

## 2018-10-04 LAB — HEMOGLOBIN A1C
Est. average glucose Bld gHb Est-mCnc: 108 mg/dL
Hgb A1c MFr Bld: 5.4 % (ref 4.8–5.6)

## 2018-10-04 NOTE — Progress Notes (Signed)
Asked pt to f/u with you re: lab values and any possible mgmt.

## 2018-11-09 ENCOUNTER — Other Ambulatory Visit: Payer: Self-pay | Admitting: General Surgery

## 2018-11-09 ENCOUNTER — Telehealth: Payer: Self-pay | Admitting: *Deleted

## 2018-11-09 DIAGNOSIS — Z1211 Encounter for screening for malignant neoplasm of colon: Secondary | ICD-10-CM

## 2018-11-09 MED ORDER — POLYETHYLENE GLYCOL 3350 17 GM/SCOOP PO POWD: ORAL | 0 refills | Status: DC

## 2018-11-09 NOTE — Telephone Encounter (Signed)
Patient contacted today and she wishes to proceed with colonoscopy.   The patient wishes to get this scheduled for 11-15-18 at [facility] with Dr. Bary Castilla.   Patient confirms no changes in medication or medical history.   Colonoscopy instructions have been faxed to the patient per her request. She is aware to call if she has further questions.  Miralax prescription has been sent in electronically to [facility] in [employer] on [address].

## 2018-11-09 NOTE — Telephone Encounter (Signed)
-----   Message from Lesly Rubenstein, LPN sent at 42/37/0230  2:53 PM EST ----- I left her a message to call the office to schedule this.  ----- Message ----- From: Dominga Ferry, CMA Sent: 09/04/2018   5:22 PM EST To: Lesly Rubenstein, LPN  Can you please contact patient to see when she wants to do her colonoscopy in January? Thanks.

## 2018-11-14 ENCOUNTER — Telehealth: Payer: Self-pay | Admitting: General Surgery

## 2018-11-14 NOTE — Telephone Encounter (Signed)
Trish from [facility] has called and stated when talking to patient to notify the patient of her scheduled colonoscopy for tomorrow (11/14/18), the patient stated that she had called our office already to cancel this. No notes in patient's chart regarding cancellation. Please follow up with patient to confirm cancellation and if colonoscopy is needing to be rescheduled.

## 2018-11-14 NOTE — Telephone Encounter (Signed)
Spoke with the patient and she wants to cancel her colonoscopy for now. She will call us back in a couple months to reschedule this. Endo notified of cancellation.

## 2018-11-15 ENCOUNTER — Ambulatory Visit: Admission: RE | Admit: 2018-11-15 | Payer: 59 | Source: Ambulatory Visit | Admitting: General Surgery

## 2018-11-15 ENCOUNTER — Encounter: Admission: RE | Payer: Self-pay | Source: Ambulatory Visit

## 2018-11-15 SURGERY — COLONOSCOPY WITH PROPOFOL
Anesthesia: General

## 2018-12-01 ENCOUNTER — Other Ambulatory Visit: Payer: Self-pay

## 2018-12-01 ENCOUNTER — Encounter: Payer: Self-pay | Admitting: Family Medicine

## 2018-12-01 ENCOUNTER — Ambulatory Visit: Payer: 59 | Admitting: Family Medicine

## 2018-12-01 VITALS — BP 120/60 | HR 94 | Temp 98.1°F | Resp 16 | Wt 172.0 lb

## 2018-12-01 DIAGNOSIS — R05 Cough: Secondary | ICD-10-CM

## 2018-12-01 DIAGNOSIS — J0100 Acute maxillary sinusitis, unspecified: Secondary | ICD-10-CM

## 2018-12-01 DIAGNOSIS — R059 Cough, unspecified: Secondary | ICD-10-CM

## 2018-12-01 MED ORDER — AMOXICILLIN 875 MG PO TABS: 875.0000 mg | ORAL_TABLET | Freq: Two times a day (BID) | ORAL | 0 refills | Status: DC

## 2018-12-01 NOTE — Progress Notes (Signed)
Patient: Mary Christian Female    DOB: Jan 31, 1961   58 y.o.   MRN: [id number] Visit Date: 12/01/2018  Today's Provider: Vernie Murders, PA   Chief Complaint  Patient presents with  . Cough   Subjective:     Cough  This is a new problem. The current episode started 1 to 4 weeks ago (3 weeks). Associated symptoms include ear congestion, headaches, nasal congestion, a sore throat and wheezing. Pertinent negatives include no chest pain, chills, ear pain, fever, heartburn, hemoptysis, myalgias, postnasal drip, rash, rhinorrhea, shortness of breath, sweats or weight loss. Treatments tried: Robitussin and Mucinex.   Patient has had productive cough for 3 weeks. Patient states symptoms started with sinus congestion. Patient also has symptoms of wheezing, chest tightness, sinus pressure, sore throat and ear congestion. Patient has been taking otc Robitussin and Mucinex with mild relief. No recent travel or exposure to coronavirus positive patients.  Past Medical History:  Diagnosis Date  . Abnormal vaginal bleeding   . Anxiety   . 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 GERD (gastroesophageal reflux disease)   . Hiatal hernia   . Hypertension   . Renal insufficiency    Past Surgical History:  Procedure Laterality Date  . CESAREAN SECTION    . COLONOSCOPY  08/04/2006   Dr Bary Castilla  . DILATATION & CURETTAGE/HYSTEROSCOPY WITH MYOSURE N/A 03/05/2016   Procedure: DILATATION & CURETTAGE/HYSTEROSCOPY WITH MYOSURE;  Surgeon: Will Bonnet, MD;  Location: [facility] ORS;  Service: Gynecology;  Laterality: N/A;  . DILATION AND CURETTAGE OF UTERUS    . TUBAL LIGATION    . UPPER GI ENDOSCOPY  08/04/2006   Dr Bary Castilla   Family History  Problem Relation Age of Onset  . Hypertension Father   . Heart disease Father        CABG x 3  . Heart attack Father 35  . Colon cancer Father   . Liver cancer Father   . Hypertension Sister   . Hyperlipidemia Sister   . Skin cancer Sister    78s  . Hypertension Brother   . Hypertension Sister   . Hyperlipidemia Sister   . Hypertension Mother   . Colon cancer Mother 93       with mets.  . Heart Problems Maternal Grandfather   . Colon cancer Paternal Grandfather   . Cancer Paternal Aunt        ovarian   No Known Allergies  Current Outpatient Medications:  .  ALPRAZolam (XANAX) 0.5 MG tablet, TAKE 0.5-1 TABLET BY MOUTH DAILY AS NEEDED FOR SYMPTOMS, Disp: 30 tablet, Rfl: 0 .  amLODipine (NORVASC) 10 MG tablet, Take 1 tablet (10 mg total) by mouth daily., Disp: 90 tablet, Rfl: 4 .  CALCIUM CITRATE-VITAMIN D PO, Take 630 mg by mouth daily., Disp: , Rfl:  .  cetirizine (ZYRTEC) 10 MG tablet, Take 10 mg by mouth daily., Disp: , Rfl:  .  losartan (COZAAR) 100 MG tablet, Take 1 tablet (100 mg total) by mouth daily., Disp: 90 tablet, Rfl: 4 .  omeprazole (PRILOSEC) 20 MG capsule, Take 20 mg by mouth daily., Disp: , Rfl:  .  PARoxetine (PAXIL) 20 MG tablet, Take 1 tablet (20 mg total) by mouth daily., Disp: 90 tablet, Rfl: 3 .  polyethylene glycol powder (GLYCOLAX/MIRALAX) powder, 255 grams one bottle for colonoscopy prep, Disp: 255 g, Rfl: 0  Review of Systems  Constitutional: Negative for appetite change, chills, fatigue, fever and weight loss.  HENT: Positive for congestion, sinus pressure and sore throat. Negative for ear pain, postnasal drip and rhinorrhea.   Respiratory: Positive for cough, chest tightness and wheezing. Negative for hemoptysis and shortness of breath.   Cardiovascular: Negative for chest pain and palpitations.  Gastrointestinal: Negative for abdominal pain, heartburn, nausea and vomiting.  Musculoskeletal: Negative for myalgias.  Skin: Negative for rash.  Neurological: Positive for headaches. Negative for dizziness and weakness.   Social History   Tobacco Use  . Smoking status: Former Smoker    Packs/day: 1.00    Years: 3.00    Pack years: 3.00    Types: Cigarettes    Last attempt to quit: 05/01/1985     Years since quitting: 33.6  . Smokeless tobacco: Never Used  Substance Use Topics  . Alcohol use: Yes    Alcohol/week: 1.0 standard drinks    Types: 1 Glasses of wine per week     Objective:   BP 120/60 (BP Location: Right Arm, Patient Position: Sitting, Cuff Size: Large)   Pulse 94   Temp 98.1 F (36.7 C) (Oral)   Resp 16   Wt 172 lb (78 kg)   LMP 06/16/2016   SpO2 97%   BMI 28.62 kg/m  Vitals:   12/01/18 1332  BP: 120/60  Pulse: 94  Resp: 16  Temp: 98.1 F (36.7 C)  TempSrc: Oral  SpO2: 97%  Weight: 172 lb (78 kg)   Physical Exam Constitutional:      General: She is not in acute distress.    Appearance: She is well-developed.  HENT:     Head: Normocephalic and atraumatic.     Right Ear: Hearing and tympanic membrane normal.     Left Ear: Hearing and tympanic membrane normal.     Nose: Nose normal.     Comments: Some sinus tenderness with poor transillumination of maxillary sinuses. Turbinates slightly swollen and reddened.    Mouth/Throat:     Pharynx: Oropharynx is clear.  Eyes:     General: Lids are normal. No scleral icterus.       Right eye: No discharge.        Left eye: No discharge.     Conjunctiva/sclera: Conjunctivae normal.  Neck:     Musculoskeletal: Neck supple.  Cardiovascular:     Rate and Rhythm: Normal rate and regular rhythm.  Pulmonary:     Effort: Pulmonary effort is normal. No respiratory distress.     Breath sounds: Normal breath sounds. No wheezing or rhonchi.  Abdominal:     General: Bowel sounds are normal.     Palpations: Abdomen is soft.  Musculoskeletal: Normal range of motion.  Lymphadenopathy:     Cervical: No cervical adenopathy.  Skin:    Findings: No lesion or rash.  Neurological:     Mental Status: She is alert and oriented to person, place, and time.  Psychiatric:        Speech: Speech normal.        Behavior: Behavior normal.        Thought Content: Thought content normal.       Assessment & Plan    1.  Acute non-recurrent maxillary sinusitis Onset over the past 3 weeks after URI with allergic rhinitis component. Some sneezing and clear drainage now. Maxillary sinuses slight tender with poor transillumination. No fever today but slight headache. May continue Zyrtec for allergies and add Flonase Nasal Spray. Add Amoxil, increase fluid intake and use Tylenol or Advil prn. Recheck if no better  in 5-7 days. - amoxicillin (AMOXIL) 875 MG tablet; Take 1 tablet (875 mg total) by mouth 2 (two) times daily.  Dispense: 20 tablet; Refill: 0  2. Cough Ticklish cough over the past week. Suspect secondary to PND from sinusitis. No fever or sputum production. Clear to auscultation today.     Vernie Murders, PA  [facility]

## 2018-12-06 ENCOUNTER — Telehealth: Payer: Self-pay | Admitting: Family Medicine

## 2018-12-06 NOTE — Telephone Encounter (Signed)
Can try Hycodan for nighttime cough. Make sure no Covid exposure risk

## 2018-12-06 NOTE — Telephone Encounter (Signed)
No exposure and pt would be good with the hycodan.  [facility] [employer].

## 2018-12-06 NOTE — Telephone Encounter (Signed)
Per the note is was non productive at the time she saw [employer].  Can you advise

## 2018-12-06 NOTE — Telephone Encounter (Signed)
Pt has been on an antibiotic and cough has not gotten better.   Congested - Productive cough -No fever.  Please advise.  Thanks, [employer]

## 2018-12-24 ENCOUNTER — Other Ambulatory Visit: Payer: Self-pay | Admitting: Obstetrics and Gynecology

## 2018-12-24 DIAGNOSIS — F419 Anxiety disorder, unspecified: Secondary | ICD-10-CM

## 2019-01-10 ENCOUNTER — Telehealth: Payer: Self-pay

## 2019-01-10 ENCOUNTER — Ambulatory Visit: Payer: 59 | Admitting: Family Medicine

## 2019-01-10 ENCOUNTER — Encounter: Payer: Self-pay | Admitting: Family Medicine

## 2019-01-10 ENCOUNTER — Other Ambulatory Visit: Payer: Self-pay

## 2019-01-10 VITALS — BP 140/72 | HR 82 | Temp 98.0°F | Ht 65.0 in | Wt 167.2 lb

## 2019-01-10 DIAGNOSIS — R300 Dysuria: Secondary | ICD-10-CM

## 2019-01-10 DIAGNOSIS — N3090 Cystitis, unspecified without hematuria: Secondary | ICD-10-CM | POA: Diagnosis not present

## 2019-01-10 DIAGNOSIS — I10 Essential (primary) hypertension: Secondary | ICD-10-CM

## 2019-01-10 DIAGNOSIS — I129 Hypertensive chronic kidney disease with stage 1 through stage 4 chronic kidney disease, or unspecified chronic kidney disease: Secondary | ICD-10-CM

## 2019-01-10 LAB — POCT URINALYSIS DIPSTICK
Glucose, UA: NEGATIVE
Nitrite, UA: POSITIVE
Protein, UA: POSITIVE — AB
Spec Grav, UA: 1.015 (ref 1.010–1.025)
Urobilinogen, UA: 0.2 E.U./dL
pH, UA: 7 (ref 5.0–8.0)

## 2019-01-10 MED ORDER — NITROFURANTOIN MONOHYD MACRO 100 MG PO CAPS: 100.0000 mg | ORAL_CAPSULE | Freq: Two times a day (BID) | ORAL | 0 refills | Status: DC

## 2019-01-10 NOTE — Progress Notes (Signed)
nitrof       Patient: Mary Christian Female    DOB: 07-30-1961   58 y.o.   MRN: [id number] Visit Date: 01/10/2019  Today's Provider: Wilhemena Durie, MD   Chief Complaint  Patient presents with  . burning with urination    started this morning   Subjective:     Urinary Tract Infection   This is a new problem. The current episode started today. The problem occurs every urination. The problem has been gradually worsening. The quality of the pain is described as burning. The pain is at a severity of 6/10. The pain is moderate. There has been no fever. She is sexually active. There is no history of pyelonephritis. She has tried nothing for the symptoms.    Pt reports she has burning with urination that started this morning 01/10/19. No hematuria.  No history of honeymooners cystitis in the past. She says she has a history of mild kidney damage/CKD.  Unknown etiology. No Known Allergies   Current Outpatient Medications:  .  ALPRAZolam (XANAX) 0.5 MG tablet, TAKE 0.5-1 TABLET BY MOUTH DAILY AS NEEDED FOR SYMPTOMS, Disp: 30 tablet, Rfl: 0 .  amLODipine (NORVASC) 10 MG tablet, Take 1 tablet (10 mg total) by mouth daily., Disp: 90 tablet, Rfl: 4 .  CALCIUM CITRATE-VITAMIN D PO, Take 630 mg by mouth daily., Disp: , Rfl:  .  cetirizine (ZYRTEC) 10 MG tablet, Take 10 mg by mouth daily., Disp: , Rfl:  .  losartan (COZAAR) 100 MG tablet, Take 1 tablet (100 mg total) by mouth daily., Disp: 90 tablet, Rfl: 4 .  omeprazole (PRILOSEC) 20 MG capsule, Take 20 mg by mouth daily., Disp: , Rfl:  .  PARoxetine (PAXIL) 20 MG tablet, Take 1 tablet (20 mg total) by mouth daily., Disp: 90 tablet, Rfl: 3 .  polyethylene glycol powder (GLYCOLAX/MIRALAX) powder, 255 grams one bottle for colonoscopy prep (Patient not taking: Reported on 01/10/2019), Disp: 255 g, Rfl: 0  Review of Systems  Constitutional: Negative.   HENT: Negative.   Eyes: Negative.   Respiratory: Negative.   Cardiovascular: Negative.    Gastrointestinal: Negative.   Endocrine: Negative.   Genitourinary: Positive for dysuria (started this morning 01/10/19).  Musculoskeletal: Negative.   Skin: Negative.   Allergic/Immunologic: Negative.   Neurological: Negative.   Hematological: Negative.   Psychiatric/Behavioral: Negative.     Social History   Tobacco Use  . Smoking status: Former Smoker    Packs/day: 1.00    Years: 3.00    Pack years: 3.00    Types: Cigarettes    Last attempt to quit: 05/01/1985    Years since quitting: 33.7  . Smokeless tobacco: Never Used  Substance Use Topics  . Alcohol use: Yes    Alcohol/week: 1.0 standard drinks    Types: 1 Glasses of wine per week      Objective:   BP 140/72 (BP Location: Right Arm, Patient Position: Sitting, Cuff Size: Normal)   Pulse 82   Temp 98 F (36.7 C) (Oral)   Ht 5\' 5"  (1.651 m)   Wt 167 lb 3.2 oz (75.8 kg)   LMP 06/16/2016   SpO2 97%   BMI 27.82 kg/m  Vitals:   01/10/19 1355  BP: 140/72  Pulse: 82  Temp: 98 F (36.7 C)  TempSrc: Oral  SpO2: 97%  Weight: 167 lb 3.2 oz (75.8 kg)  Height: 5\' 5"  (1.651 m)     Physical Exam Vitals signs reviewed.  Constitutional:  Appearance: Normal appearance.  HENT:     Head: Normocephalic and atraumatic.     Right Ear: External ear normal.     Left Ear: External ear normal.     Nose: Nose normal.     Mouth/Throat:     Pharynx: Oropharynx is clear.  Eyes:     General: No scleral icterus. Cardiovascular:     Rate and Rhythm: Normal rate and regular rhythm.     Heart sounds: Normal heart sounds.  Pulmonary:     Effort: Pulmonary effort is normal.     Breath sounds: Normal breath sounds.  Abdominal:     Palpations: Abdomen is soft.     Tenderness: There is no right CVA tenderness or left CVA tenderness.  Skin:    General: Skin is warm and dry.  Neurological:     General: No focal deficit present.     Mental Status: She is alert and oriented to person, place, and time.  Psychiatric:         Mood and Affect: Mood normal.        Behavior: Behavior normal.        Thought Content: Thought content normal.        Judgment: Judgment normal.         Assessment & Plan    1. Burning with urination Patient advised to push fluids and try some daily cranberry juice. - POCT Urinalysis Dipstick - Urine Culture - nitrofurantoin, macrocrystal-monohydrate, (MACROBID) 100 MG capsule; Take 1 capsule (100 mg total) by mouth 2 (two) times daily.  Dispense: 14 capsule; Refill: 0  2. Cystitis Pending culture treat with nitrofurantoin twice a day for 5 days  3. Essential hypertension Controlled on amlodipine  4. Hypertensive nephropathy Follow yearly lab work.    I have done the exam and reviewed the above chart and it is accurate to the best of my knowledge. Development worker, community has been used in this note in any air is in the dictation or transcription are unintentional.  Wilhemena Durie, MD  [place]

## 2019-01-10 NOTE — Telephone Encounter (Signed)
Spoke to pt and scheduled appt.  dbs

## 2019-01-10 NOTE — Telephone Encounter (Signed)
Please review. Thanks!  

## 2019-01-10 NOTE — Telephone Encounter (Signed)
Patient calling that she has a UTI and that Dr.Gilbert knows her history.she is requesting if a antibiotic can be send to her pharmacy please.Offered patient an appointment but patient insisted that I send a message. Please Review. Thanks.  Pharmacy: [facility] not the one in [facility].

## 2019-01-10 NOTE — Telephone Encounter (Signed)
Needs appt

## 2019-01-14 LAB — URINE CULTURE

## 2019-02-13 ENCOUNTER — Telehealth: Payer: Self-pay | Admitting: Cardiovascular Disease

## 2019-02-13 NOTE — Telephone Encounter (Signed)
Called [facility] to see if they had Losartan in stock and they do.  Called patient to see if it was ok to send prescription there temporarily. She did not answer and [facility] did not pick up. Will have to try again at another time.

## 2019-02-13 NOTE — Telephone Encounter (Signed)
Pt c/o medication issue:  1. Name of Medication: Losartan   2. How are you currently taking this medication (dosage and times per day)? 100 mg po q d   3. Are you having a reaction (difficulty breathing--STAT)? No   4. What is your medication issue? [facility] [address] [facility] and patient is out of meds so should she switch to a new rx   Please call

## 2019-02-14 NOTE — Telephone Encounter (Signed)
Called patient and she said she went to pharmacy today and was able to get her medication. Nothing further needed from [place].

## 2019-05-27 ENCOUNTER — Other Ambulatory Visit: Payer: Self-pay | Admitting: Obstetrics and Gynecology

## 2019-05-27 DIAGNOSIS — F419 Anxiety disorder, unspecified: Secondary | ICD-10-CM

## 2019-05-29 ENCOUNTER — Other Ambulatory Visit: Payer: Self-pay | Admitting: Obstetrics and Gynecology

## 2019-05-29 NOTE — Telephone Encounter (Signed)
Please advise 

## 2019-06-06 ENCOUNTER — Telehealth: Payer: Self-pay

## 2019-06-06 ENCOUNTER — Other Ambulatory Visit: Payer: Self-pay

## 2019-06-06 DIAGNOSIS — Z800 Family history of malignant neoplasm of digestive organs: Secondary | ICD-10-CM

## 2019-06-06 DIAGNOSIS — Z1211 Encounter for screening for malignant neoplasm of colon: Secondary | ICD-10-CM

## 2019-06-06 MED ORDER — NA SULFATE-K SULFATE-MG SULF 17.5-3.13-1.6 GM/177ML PO SOLN: 1.0000 | Freq: Once | ORAL | 0 refills | Status: AC

## 2019-06-06 NOTE — Telephone Encounter (Signed)
Pt aware.

## 2019-06-06 NOTE — Telephone Encounter (Signed)
Order placed, sent to [facility] to sched.

## 2019-06-06 NOTE — Telephone Encounter (Signed)
Pls let pt know ref being sent electronically (per our protocols) and appt will be sched for her. Thx

## 2019-06-06 NOTE — Telephone Encounter (Signed)
Patient needs referral sent to [facility] for Colonoscopy since Dr. Chestine Spore don't do it at [facility] anymore. Requesting it to be faxed to [phone].

## 2019-06-08 ENCOUNTER — Telehealth: Payer: Self-pay

## 2019-06-08 NOTE — Telephone Encounter (Signed)
LMTCB covid screening

## 2019-06-11 ENCOUNTER — Encounter: Payer: Self-pay | Admitting: Family Medicine

## 2019-06-11 ENCOUNTER — Ambulatory Visit (INDEPENDENT_AMBULATORY_CARE_PROVIDER_SITE_OTHER): Payer: 59 | Admitting: Family Medicine

## 2019-06-11 ENCOUNTER — Encounter: Payer: Self-pay | Admitting: *Deleted

## 2019-06-11 ENCOUNTER — Other Ambulatory Visit: Payer: Self-pay

## 2019-06-11 VITALS — BP 145/88 | HR 92 | Temp 97.1°F | Resp 18 | Wt 170.4 lb

## 2019-06-11 DIAGNOSIS — Z23 Encounter for immunization: Secondary | ICD-10-CM

## 2019-06-11 DIAGNOSIS — F1010 Alcohol abuse, uncomplicated: Secondary | ICD-10-CM

## 2019-06-11 MED ORDER — NALTREXONE HCL 50 MG PO TABS: 25.0000 mg | ORAL_TABLET | Freq: Every day | ORAL | 1 refills | Status: DC

## 2019-06-11 NOTE — Patient Instructions (Signed)
Try Melatonin 5 mg to 10 mg qhs

## 2019-06-11 NOTE — Progress Notes (Signed)
Patient: Mary Christian Female    DOB: Apr 24, 1961   58 y.o.   MRN: [id number] Visit Date: 06/11/2019  Today's Provider: Wilhemena Durie, MD   No chief complaint on file.  Subjective:     HPI  Patient would to discuss drinking an average of 4 glasses of wine from 6 pm to10 pm every night to help with sleep. She would like to discuss trying Naltrexone.  She has not had any legal or medical problems from her drinking.  She and her husband just want to quit.  No Known Allergies   Current Outpatient Medications:  .  ALPRAZolam (XANAX) 0.5 MG tablet, TAKE 0.5-1 TABLET BY MOUTH DAILY AS NEEDED FOR SYMPTOMS, Disp: 30 tablet, Rfl: 0 .  amLODipine (NORVASC) 10 MG tablet, Take 1 tablet (10 mg total) by mouth daily., Disp: 90 tablet, Rfl: 4 .  CALCIUM CITRATE-VITAMIN D PO, Take 630 mg by mouth daily., Disp: , Rfl:  .  cetirizine (ZYRTEC) 10 MG tablet, Take 10 mg by mouth daily., Disp: , Rfl:  .  losartan (COZAAR) 100 MG tablet, Take 1 tablet (100 mg total) by mouth daily., Disp: 90 tablet, Rfl: 4 .  PARoxetine (PAXIL) 20 MG tablet, Take 1 tablet (20 mg total) by mouth daily., Disp: 90 tablet, Rfl: 3 .  nitrofurantoin, macrocrystal-monohydrate, (MACROBID) 100 MG capsule, Take 1 capsule (100 mg total) by mouth 2 (two) times daily. (Patient not taking: Reported on 06/11/2019), Disp: 14 capsule, Rfl: 0 .  omeprazole (PRILOSEC) 20 MG capsule, Take 20 mg by mouth daily., Disp: , Rfl:  .  polyethylene glycol powder (GLYCOLAX/MIRALAX) powder, 255 grams one bottle for colonoscopy prep (Patient not taking: Reported on 01/10/2019), Disp: 255 g, Rfl: 0  Review of Systems  Constitutional: Negative.   Respiratory: Negative.   Cardiovascular: Negative.   Endocrine: Negative.   Allergic/Immunologic: Negative.   Neurological: Negative.   Hematological: Negative.   Psychiatric/Behavioral: Negative.     Social History   Tobacco Use  . Smoking status: Former Smoker    Packs/day: 1.00    Years: 3.00     Pack years: 3.00    Types: Cigarettes    Quit date: 05/01/1985    Years since quitting: 34.1  . Smokeless tobacco: Never Used  Substance Use Topics  . Alcohol use: Yes    Alcohol/week: 1.0 standard drinks    Types: 1 Glasses of wine per week      Objective:   BP (!) 145/88 (BP Location: Right Arm, Patient Position: Sitting, Cuff Size: Normal)   Pulse 92   Temp (!) 97.1 F (36.2 C) (Temporal)   Resp 18   Wt 170 lb 6.4 oz (77.3 kg)   LMP 06/16/2016   SpO2 99%   BMI 28.36 kg/m  Vitals:   06/11/19 1520  BP: (!) 145/88  Pulse: 92  Resp: 18  Temp: (!) 97.1 F (36.2 C)  TempSrc: Temporal  SpO2: 99%  Weight: 170 lb 6.4 oz (77.3 kg)  Body mass index is 28.36 kg/m.   Physical Exam Vitals signs reviewed.  Constitutional:      Appearance: Normal appearance.  HENT:     Head: Normocephalic and atraumatic.     Right Ear: External ear normal.     Left Ear: External ear normal.  Eyes:     General: No scleral icterus. Cardiovascular:     Rate and Rhythm: Normal rate and regular rhythm.     Heart sounds: Normal heart sounds.  Pulmonary:     Breath sounds: Normal breath sounds.  Skin:    General: Skin is warm and dry.  Neurological:     General: No focal deficit present.     Mental Status: She is alert and oriented to person, place, and time.  Psychiatric:        Mood and Affect: Mood normal.        Behavior: Behavior normal.        Thought Content: Thought content normal.      No results found for any visits on 06/11/19.     Assessment & Plan    1. Need for immunization against influenza  - Flu Vaccine QUAD 36+ mos IM  2. Alcohol use disorder, mild, abuse Naltrexone 0.25 mg daily.  Return to clinic 1 month.      Cranford Mon, MD  [facility]

## 2019-06-12 ENCOUNTER — Other Ambulatory Visit
Admission: RE | Admit: 2019-06-12 | Discharge: 2019-06-12 | Disposition: A | Discharge status: Home / Self Care | Payer: 59 | Source: Ambulatory Visit | Attending: Gastroenterology | Admitting: Gastroenterology

## 2019-06-12 DIAGNOSIS — Z01812 Encounter for preprocedural laboratory examination: Secondary | ICD-10-CM | POA: Diagnosis not present

## 2019-06-12 DIAGNOSIS — Z20828 Contact with and (suspected) exposure to other viral communicable diseases: Secondary | ICD-10-CM | POA: Diagnosis not present

## 2019-06-12 LAB — SARS CORONAVIRUS 2 (TAT 6-24 HRS): SARS Coronavirus 2: NEGATIVE

## 2019-06-13 NOTE — Anesthesia Preprocedure Evaluation (Addendum)
Anesthesia Evaluation  Patient identified by MRN, date of birth, ID band Patient awake    Reviewed: Allergy & Precautions, NPO status , Patient's Chart, lab work & pertinent test results  History of Anesthesia Complications Negative for: history of anesthetic complications  Airway Mallampati: II   Neck ROM: Full    Dental no notable dental hx.    Pulmonary neg pulmonary ROS, former smoker (quit 1986),    Pulmonary exam normal breath sounds clear to auscultation       Cardiovascular hypertension, Normal cardiovascular exam Rhythm:Regular Rate:Normal  ECG 09/26/18: normal   Neuro/Psych PSYCHIATRIC DISORDERS Anxiety negative neurological ROS     GI/Hepatic hiatal hernia, GERD  ,  Endo/Other  negative endocrine ROS  Renal/GU Renal InsufficiencyRenal disease     Musculoskeletal   Abdominal   Peds  Hematology negative hematology ROS (+)   Anesthesia Other Findings   Reproductive/Obstetrics                            Anesthesia Physical Anesthesia Plan  ASA: II  Anesthesia Plan: General   Post-op Pain Management:    Induction: Intravenous  PONV Risk Score and Plan: 3 and TIVA and Propofol infusion  Airway Management Planned: Natural Airway  Additional Equipment:   Intra-op Plan:   Post-operative Plan:   Informed Consent: I have reviewed the patients History and Physical, chart, labs and discussed the procedure including the risks, benefits and alternatives for the proposed anesthesia with the patient or authorized representative who has indicated his/her understanding and acceptance.       Plan Discussed with: CRNA  Anesthesia Plan Comments:        Anesthesia Quick Evaluation

## 2019-06-15 ENCOUNTER — Ambulatory Visit
Admission: RE | Admit: 2019-06-15 | Discharge: 2019-06-15 | Disposition: A | Discharge status: Home / Self Care | Payer: No Typology Code available for payment source | Attending: Gastroenterology | Admitting: Gastroenterology

## 2019-06-15 ENCOUNTER — Ambulatory Visit: Payer: No Typology Code available for payment source | Admitting: Anesthesiology

## 2019-06-15 ENCOUNTER — Encounter
Admission: RE | Disposition: A | Discharge status: Home / Self Care | Payer: Self-pay | Source: Home / Self Care | Attending: Gastroenterology

## 2019-06-15 ENCOUNTER — Other Ambulatory Visit: Payer: Self-pay

## 2019-06-15 DIAGNOSIS — Z1211 Encounter for screening for malignant neoplasm of colon: Secondary | ICD-10-CM | POA: Diagnosis not present

## 2019-06-15 DIAGNOSIS — D123 Benign neoplasm of transverse colon: Secondary | ICD-10-CM | POA: Insufficient documentation

## 2019-06-15 DIAGNOSIS — K640 First degree hemorrhoids: Secondary | ICD-10-CM | POA: Insufficient documentation

## 2019-06-15 DIAGNOSIS — K219 Gastro-esophageal reflux disease without esophagitis: Secondary | ICD-10-CM | POA: Diagnosis not present

## 2019-06-15 DIAGNOSIS — Z87891 Personal history of nicotine dependence: Secondary | ICD-10-CM | POA: Diagnosis not present

## 2019-06-15 DIAGNOSIS — I10 Essential (primary) hypertension: Secondary | ICD-10-CM | POA: Insufficient documentation

## 2019-06-15 DIAGNOSIS — D122 Benign neoplasm of ascending colon: Secondary | ICD-10-CM | POA: Insufficient documentation

## 2019-06-15 DIAGNOSIS — K635 Polyp of colon: Secondary | ICD-10-CM

## 2019-06-15 DIAGNOSIS — F419 Anxiety disorder, unspecified: Secondary | ICD-10-CM | POA: Insufficient documentation

## 2019-06-15 DIAGNOSIS — Z79899 Other long term (current) drug therapy: Secondary | ICD-10-CM | POA: Diagnosis not present

## 2019-06-15 DIAGNOSIS — D125 Benign neoplasm of sigmoid colon: Secondary | ICD-10-CM | POA: Diagnosis not present

## 2019-06-15 DIAGNOSIS — K449 Diaphragmatic hernia without obstruction or gangrene: Secondary | ICD-10-CM | POA: Diagnosis not present

## 2019-06-15 HISTORY — PX: POLYPECTOMY: SHX5525

## 2019-06-15 HISTORY — PX: COLONOSCOPY WITH PROPOFOL: SHX5780

## 2019-06-15 SURGERY — COLONOSCOPY WITH PROPOFOL
Anesthesia: General | Site: Rectum

## 2019-06-15 MED ORDER — ONDANSETRON HCL 4 MG/2ML IJ SOLN: 4.0000 mg | Freq: Once | INTRAMUSCULAR | Status: DC | PRN

## 2019-06-15 MED ORDER — LACTATED RINGERS IV SOLN
10.0000 mL/h | INTRAVENOUS | Status: DC
  Administered 2019-06-15: 09:00:00 10 mL/h via INTRAVENOUS

## 2019-06-15 MED ORDER — STERILE WATER FOR IRRIGATION IR SOLN
Status: DC | PRN
  Administered 2019-06-15: 10:00:00 50 mL

## 2019-06-15 MED ORDER — LIDOCAINE HCL (CARDIAC) PF 100 MG/5ML IV SOSY
PREFILLED_SYRINGE | INTRAVENOUS | Status: DC | PRN
  Administered 2019-06-15: 30 mg via INTRAVENOUS

## 2019-06-15 MED ORDER — PROPOFOL 10 MG/ML IV BOLUS
INTRAVENOUS | Status: DC | PRN
  Administered 2019-06-15: 20 mg via INTRAVENOUS
  Administered 2019-06-15: 50 mg via INTRAVENOUS
  Administered 2019-06-15: 100 mg via INTRAVENOUS
  Administered 2019-06-15: 50 mg via INTRAVENOUS
  Administered 2019-06-15: 20 mg via INTRAVENOUS
  Administered 2019-06-15: 40 mg via INTRAVENOUS
  Administered 2019-06-15 (×2): 20 mg via INTRAVENOUS
  Administered 2019-06-15: 100 mg via INTRAVENOUS

## 2019-06-15 MED ORDER — ACETAMINOPHEN 160 MG/5ML PO SOLN: 325.0000 mg | ORAL | Status: DC | PRN

## 2019-06-15 MED ORDER — ACETAMINOPHEN 325 MG PO TABS: 650.0000 mg | ORAL_TABLET | Freq: Once | ORAL | Status: DC | PRN

## 2019-06-15 SURGICAL SUPPLY — 9 items
CANISTER SUCT 1200ML W/VALVE (MISCELLANEOUS) ×3 IMPLANT
CLIP HMST 235XBRD CATH ROT (MISCELLANEOUS) IMPLANT
CLIP RESOLUTION 360 11X235 (MISCELLANEOUS) ×2
GOWN CVR UNV OPN BCK APRN NK (MISCELLANEOUS) ×2 IMPLANT
GOWN ISOL THUMB LOOP REG UNIV (MISCELLANEOUS) ×4
KIT ENDO PROCEDURE OLY (KITS) ×3 IMPLANT
SNARE SHORT THROW 13M SML OVAL (MISCELLANEOUS) ×2 IMPLANT
TRAP ETRAP POLY (MISCELLANEOUS) ×2 IMPLANT
WATER STERILE IRR 250ML POUR (IV SOLUTION) ×3 IMPLANT

## 2019-06-15 NOTE — Anesthesia Postprocedure Evaluation (Signed)
Anesthesia Post Note  Patient: Mary Christian  Procedure(s) Performed: COLONOSCOPY WITH Biopsy (N/A Rectum) POLYPECTOMY (N/A Rectum)  Patient location during evaluation: PACU Anesthesia Type: General Level of consciousness: awake and alert, oriented and patient cooperative Pain management: pain level controlled Vital Signs Assessment: post-procedure vital signs reviewed and stable Respiratory status: spontaneous breathing, nonlabored ventilation and respiratory function stable Cardiovascular status: blood pressure returned to baseline and stable Postop Assessment: adequate PO intake Anesthetic complications: no    Darrin Nipper

## 2019-06-15 NOTE — Transfer of Care (Signed)
Immediate Anesthesia Transfer of Care Note  Patient: Mary Christian  Procedure(s) Performed: COLONOSCOPY WITH Biopsy (N/A Rectum) POLYPECTOMY (N/A Rectum)  Patient Location: PACU  Anesthesia Type: General  Level of Consciousness: awake, alert  and patient cooperative  Airway and Oxygen Therapy: Patient Spontanous Breathing and Patient connected to supplemental oxygen  Post-op Assessment: Post-op Vital signs reviewed, Patient's Cardiovascular Status Stable, Respiratory Function Stable, Patent Airway and No signs of Nausea or vomiting  Post-op Vital Signs: Reviewed and stable  Complications: No apparent anesthesia complications

## 2019-06-15 NOTE — Op Note (Signed)
[facility] Patient Name: Mary Christian Procedure Date: 06/15/2019 9:28 AM MRN: [id number] Account #: [id number] Date of Birth: 1960/10/18 Admit Type: Outpatient Age: 58 Room: [facility] OR ROOM 01 Gender: Female Note Status: Finalized Procedure:            Colonoscopy Indications:          Screening for colorectal malignant neoplasm Providers:            Lucilla Lame MD, MD Referring MD:         Janine Ores. Rosanna Randy, MD (Referring MD) Medicines:            Propofol per Anesthesia Complications:        No immediate complications. Procedure:            Pre-Anesthesia Assessment:                       - Prior to the procedure, a History and Physical was                        performed, and patient medications and allergies were                        reviewed. The patient's tolerance of previous                        anesthesia was also reviewed. The risks and benefits of                        the procedure and the sedation options and risks were                        discussed with the patient. All questions were                        answered, and informed consent was obtained. Prior                        Anticoagulants: The patient has taken no previous                        anticoagulant or antiplatelet agents. ASA Grade                        Assessment: II - A patient with mild systemic disease.                        After reviewing the risks and benefits, the patient was                        deemed in satisfactory condition to undergo the                        procedure.                       After obtaining informed consent, the colonoscope was                        passed under direct vision. Throughout the procedure,  the patient's blood pressure, pulse, and oxygen                        saturations were monitored continuously. The was                        introduced through the anus and advanced to the the                 cecum, identified by appendiceal orifice and ileocecal                        valve. The colonoscopy was performed without                        difficulty. The patient tolerated the procedure well.                        The quality of the bowel preparation was excellent. Findings:      The perianal and digital rectal examinations were normal.      Three sessile polyps were found in the ascending colon. The polyps were       4 to 8 mm in size. These polyps were removed with a cold snare.       Resection and retrieval were complete. To prevent bleeding       post-intervention, one hemostatic clip was successfully placed (MR       conditional). There was no bleeding at the end of the procedure.      Two sessile polyps were found in the transverse colon. The polyps were 3       to 4 mm in size. These polyps were removed with a cold snare. Resection       and retrieval were complete.      A 3 mm polyp was found in the sigmoid colon. The polyp was sessile. The       polyp was removed with a cold snare. Resection and retrieval were       complete.      Non-bleeding internal hemorrhoids were found during retroflexion. The       hemorrhoids were Grade I (internal hemorrhoids that do not prolapse). Impression:           - Three 4 to 8 mm polyps in the ascending colon,                        removed with a cold snare. Resected and retrieved. Clip                        (MR conditional) was placed.                       - Two 3 to 4 mm polyps in the transverse colon, removed                        with a cold snare. Resected and retrieved.                       - One 3 mm polyp in the sigmoid colon, removed with a  cold snare. Resected and retrieved.                       - Non-bleeding internal hemorrhoids. Recommendation:       - Discharge patient to home.                       - Resume previous diet.                       - Continue present medications.                        - Await pathology results.                       - Repeat colonoscopy in 5 years if polyp adenoma and 10                        years if hyperplastic Procedure Code(s):    --- Professional ---                       [id number], Colonoscopy, flexible; with removal of tumor(s),                        polyp(s), or other lesion(s) by snare technique Diagnosis Code(s):    --- Professional ---                       Z12.11, Encounter for screening for malignant neoplasm                        of colon                       K63.5, Polyp of colon CPT copyright 2019 American Medical Association. All rights reserved. The codes documented in this report are preliminary and upon coder review may  be revised to meet current compliance requirements. Lucilla Lame MD, MD 06/15/2019 9:59:48 AM This report has been signed electronically. Number of Addenda: 0 Note Initiated On: 06/15/2019 9:28 AM Scope Withdrawal Time: 0 hours 11 minutes 52 seconds  Total Procedure Duration: 0 hours 15 minutes 8 seconds  Estimated Blood Loss: Estimated blood loss: none.      [facility]

## 2019-06-15 NOTE — Anesthesia Procedure Notes (Signed)
Procedure Name: MAC Date/Time: 06/15/2019 9:37 AM Performed by: Georga Bora, CRNA Pre-anesthesia Checklist: Patient identified, Suction available, Emergency Drugs available, Patient being monitored and Timeout performed Patient Re-evaluated:Patient Re-evaluated prior to induction Oxygen Delivery Method: Nasal cannula

## 2019-06-15 NOTE — H&P (Signed)
Lucilla Lame, MD [address] [place], [place] [address] Phone: [phone] Fax : [phone]  Primary Care Physician:  Jerrol Banana., MD Primary Gastroenterologist:  Dr. Allen Norris  Pre-Procedure History & Physical: HPI:  Mary Christian is a 58 y.o. female is here for a screening colonoscopy.   Past Medical History:  Diagnosis Date  . Abnormal vaginal bleeding   . Anxiety   . 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 GERD (gastroesophageal reflux disease)   . Hiatal hernia   . Hypertension   . Renal insufficiency     Past Surgical History:  Procedure Laterality Date  . CESAREAN SECTION    . COLONOSCOPY  08/04/2006   Dr Bary Castilla  . DILATATION & CURETTAGE/HYSTEROSCOPY WITH MYOSURE N/A 03/05/2016   Procedure: DILATATION & CURETTAGE/HYSTEROSCOPY WITH MYOSURE;  Surgeon: Will Bonnet, MD;  Location: [facility] ORS;  Service: Gynecology;  Laterality: N/A;  . DILATION AND CURETTAGE OF UTERUS    . TUBAL LIGATION    . UPPER GI ENDOSCOPY  08/04/2006   Dr Bary Castilla    Prior to Admission medications   Medication Sig Start Date End Date Taking? Authorizing Provider  ALPRAZolam Duanne Moron) 0.5 MG tablet TAKE 0.5-1 TABLET BY MOUTH DAILY AS NEEDED FOR SYMPTOMS [id number]  Yes Copland, Alicia B, PA-C  amLODipine (NORVASC) 10 MG tablet Take 1 tablet (10 mg total) by mouth daily. 09/26/18  Yes Gollan, Kathlene November, MD  CALCIUM CITRATE-VITAMIN D PO Take 630 mg by mouth daily.   Yes [provider]  cetirizine (ZYRTEC) 10 MG tablet Take 10 mg by mouth daily.   Yes [provider]  losartan (COZAAR) 100 MG tablet Take 1 tablet (100 mg total) by mouth daily. 09/26/18  Yes Gollan, Kathlene November, MD  naltrexone (DEPADE) 50 MG tablet Take 0.5 tablets (25 mg total) by mouth daily. 06/11/19  Yes Jerrol Banana., MD  PARoxetine (PAXIL) 20 MG tablet Take 1 tablet (20 mg total) by mouth daily. [id number]  Yes Copland, Deirdre Evener, PA-C  omeprazole (PRILOSEC) 20 MG capsule Take 20 mg by  mouth daily.    [provider]    Allergies as of 06/06/2019  . (No Known Allergies)    Family History  Problem Relation Age of Onset  . Hypertension Father   . Heart disease Father        CABG x 3  . Heart attack Father 70  . Colon cancer Father   . Liver cancer Father   . Hypertension Sister   . Hyperlipidemia Sister   . Skin cancer Sister        12s  . Hypertension Brother   . Hypertension Sister   . Hyperlipidemia Sister   . Hypertension Mother   . Colon cancer Mother 59       with mets.  . Heart Problems Maternal Grandfather   . Colon cancer Paternal Grandfather   . Cancer Paternal Aunt        ovarian    Social History   Socioeconomic History  . Marital status: Married    Spouse name: Not on file  . Number of children: Not on file  . Years of education: Not on file  . Highest education level: Not on file  Occupational History  . Not on file  Social Needs  . Financial resource strain: Not on file  . Food insecurity    Worry: Not on file    Inability: Not on file  .  Transportation needs    Medical: Not on file    Non-medical: Not on file  Tobacco Use  . Smoking status: Former Smoker    Packs/day: 1.00    Years: 3.00    Pack years: 3.00    Types: Cigarettes    Quit date: 05/01/1985    Years since quitting: 34.1  . Smokeless tobacco: Never Used  Substance and Sexual Activity  . Alcohol use: Yes    Alcohol/week: 14.0 standard drinks    Types: 14 Glasses of wine per week  . Drug use: No  . Sexual activity: Yes    Birth control/protection: Post-menopausal  Lifestyle  . Physical activity    Days per week: Not on file    Minutes per session: Not on file  . Stress: Not on file  Relationships  . Social Herbalist on phone: Not on file    Gets together: Not on file    Attends religious service: Not on file    Active member of club or organization: Not on file    Attends meetings of clubs or organizations: Not on file     Relationship status: Not on file  . Intimate partner violence    Fear of current or ex partner: Not on file    Emotionally abused: Not on file    Physically abused: Not on file    Forced sexual activity: Not on file  Other Topics Concern  . Not on file  Social History Narrative  . Not on file    Review of Systems: See HPI, otherwise negative ROS  Physical Exam: BP (!) 165/91   Pulse 97   Temp 97.6 F (36.4 C) (Temporal)   Ht 5\' 5"  (1.651 m)   Wt 74.4 kg   LMP 06/16/2016   SpO2 95%   BMI 27.29 kg/m  General:   Alert,  pleasant and cooperative in NAD Head:  Normocephalic and atraumatic. Neck:  Supple; no masses or thyromegaly. Lungs:  Clear throughout to auscultation.    Heart:  Regular rate and rhythm. Abdomen:  Soft, nontender and nondistended. Normal bowel sounds, without guarding, and without rebound.   Neurologic:  Alert and  oriented x4;  grossly normal neurologically.  Impression/Plan: Mary Christian is now here to undergo a screening colonoscopy.  Risks, benefits, and alternatives regarding colonoscopy have been reviewed with the patient.  Questions have been answered.  All parties agreeable.

## 2019-06-18 ENCOUNTER — Encounter: Payer: Self-pay | Admitting: Gastroenterology

## 2019-06-19 ENCOUNTER — Other Ambulatory Visit: Payer: Self-pay

## 2019-06-19 ENCOUNTER — Encounter: Payer: Self-pay | Admitting: Gastroenterology

## 2019-06-19 DIAGNOSIS — Z23 Encounter for immunization: Secondary | ICD-10-CM

## 2019-06-30 ENCOUNTER — Other Ambulatory Visit: Payer: Self-pay | Admitting: Obstetrics and Gynecology

## 2019-06-30 DIAGNOSIS — N951 Menopausal and female climacteric states: Secondary | ICD-10-CM

## 2019-06-30 DIAGNOSIS — F419 Anxiety disorder, unspecified: Secondary | ICD-10-CM

## 2019-07-11 ENCOUNTER — Other Ambulatory Visit: Payer: Self-pay | Admitting: Obstetrics and Gynecology

## 2019-07-11 DIAGNOSIS — F419 Anxiety disorder, unspecified: Secondary | ICD-10-CM

## 2019-07-12 NOTE — Telephone Encounter (Signed)
Please advise 

## 2019-07-19 ENCOUNTER — Ambulatory Visit
Admission: RE | Admit: 2019-07-19 | Discharge: 2019-07-19 | Disposition: A | Discharge status: Home / Self Care | Payer: 59 | Source: Ambulatory Visit | Attending: Obstetrics and Gynecology | Admitting: Obstetrics and Gynecology

## 2019-07-19 ENCOUNTER — Other Ambulatory Visit: Payer: Self-pay

## 2019-07-19 DIAGNOSIS — Z1231 Encounter for screening mammogram for malignant neoplasm of breast: Secondary | ICD-10-CM | POA: Insufficient documentation

## 2019-07-19 DIAGNOSIS — Z1239 Encounter for other screening for malignant neoplasm of breast: Secondary | ICD-10-CM | POA: Diagnosis present

## 2019-07-25 ENCOUNTER — Other Ambulatory Visit: Payer: Self-pay | Admitting: Obstetrics and Gynecology

## 2019-07-25 DIAGNOSIS — R921 Mammographic calcification found on diagnostic imaging of breast: Secondary | ICD-10-CM

## 2019-07-25 DIAGNOSIS — R928 Other abnormal and inconclusive findings on diagnostic imaging of breast: Secondary | ICD-10-CM

## 2019-07-31 ENCOUNTER — Ambulatory Visit
Admission: RE | Admit: 2019-07-31 | Discharge: 2019-07-31 | Disposition: A | Discharge status: Home / Self Care | Payer: 59 | Source: Ambulatory Visit | Attending: Obstetrics and Gynecology | Admitting: Obstetrics and Gynecology

## 2019-07-31 DIAGNOSIS — R928 Other abnormal and inconclusive findings on diagnostic imaging of breast: Secondary | ICD-10-CM | POA: Insufficient documentation

## 2019-07-31 DIAGNOSIS — R921 Mammographic calcification found on diagnostic imaging of breast: Secondary | ICD-10-CM | POA: Diagnosis present

## 2019-08-13 ENCOUNTER — Ambulatory Visit: Payer: Self-pay | Admitting: Family Medicine

## 2019-09-18 ENCOUNTER — Ambulatory Visit: Payer: 59 | Admitting: Family Medicine

## 2019-09-26 ENCOUNTER — Other Ambulatory Visit: Payer: Self-pay | Admitting: Obstetrics and Gynecology

## 2019-09-26 DIAGNOSIS — N951 Menopausal and female climacteric states: Secondary | ICD-10-CM

## 2019-09-26 DIAGNOSIS — F419 Anxiety disorder, unspecified: Secondary | ICD-10-CM

## 2019-10-05 ENCOUNTER — Telehealth: Payer: Self-pay | Admitting: Cardiovascular Disease

## 2019-10-05 ENCOUNTER — Other Ambulatory Visit: Payer: Self-pay | Admitting: Cardiovascular Disease

## 2019-10-05 ENCOUNTER — Other Ambulatory Visit: Payer: Self-pay

## 2019-10-05 ENCOUNTER — Telehealth: Payer: Self-pay

## 2019-10-05 DIAGNOSIS — N951 Menopausal and female climacteric states: Secondary | ICD-10-CM

## 2019-10-05 DIAGNOSIS — F419 Anxiety disorder, unspecified: Secondary | ICD-10-CM

## 2019-10-05 MED ORDER — PAROXETINE HCL 20 MG PO TABS: 20.0000 mg | ORAL_TABLET | Freq: Every day | ORAL | 0 refills | Status: DC

## 2019-10-05 NOTE — Telephone Encounter (Signed)
amLODipine (NORVASC) 10 MG tablet 90 tablet 0 10/05/2019    Sig: TAKE 1 TABLET BY MOUTH EVERY DAY   Sent to pharmacy as: amLODipine (NORVASC) 10 MG tablet   E-Prescribing Status: Receipt confirmed by pharmacy (10/05/2019  3:19 PM EST)

## 2019-10-05 NOTE — Telephone Encounter (Signed)
°*  STAT* If patient is at the pharmacy, call can be transferred to refill team.   1. Which medications need to be refilled? (please list name of each medication and dose if known) amlodipine 10mg   2. Which pharmacy/location (including street and city if local pharmacy) is medication [facility] be sent to[facility] [address]  3. Do they need a 30 day or 90 day supply? [place]

## 2019-10-05 NOTE — Telephone Encounter (Signed)
Pt called triage asking for PAXIL RF, says yesterday was her last dose. Per ABC, RF sent. Pt aware.

## 2019-10-07 NOTE — Progress Notes (Signed)
Office Visit    Patient Name: Mary Christian Date of Encounter: 10/08/2019  Primary Care Provider:  Jerrol Banana., MD Primary Cardiologist:  Ida Rogue, MD Electrophysiologist:  None   Chief Complaint    Mary Christian is a 59 y.o. female with a hx of HTN, anxiety, remote tobacco use presents today for follow up of HTN.   Past Medical History    Past Medical History:  Diagnosis Date  . Abnormal vaginal bleeding   . Anxiety   . 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 GERD (gastroesophageal reflux disease)   . Hiatal hernia   . Hypertension   . Renal insufficiency    Past Surgical History:  Procedure Laterality Date  . CESAREAN SECTION    . COLONOSCOPY  08/04/2006   Dr Bary Castilla  . COLONOSCOPY WITH PROPOFOL N/A 06/15/2019   Procedure: COLONOSCOPY WITH Biopsy;  Surgeon: Lucilla Lame, MD;  Location: [place];  Service: Endoscopy;  Laterality: N/A;  . DILATATION & CURETTAGE/HYSTEROSCOPY WITH MYOSURE N/A 03/05/2016   Procedure: DILATATION & CURETTAGE/HYSTEROSCOPY WITH MYOSURE;  Surgeon: Will Bonnet, MD;  Location: [facility];  Service: Gynecology;  Laterality: N/A;  . DILATION AND CURETTAGE OF UTERUS    . POLYPECTOMY N/A 06/15/2019   Procedure: POLYPECTOMY;  Surgeon: Lucilla Lame, MD;  Location: [place];  Service: Endoscopy;  Laterality: N/A;  Clip x1  placed as site of Ascending Colon Polyp Removal  . TUBAL LIGATION    . UPPER GI ENDOSCOPY  08/04/2006   Dr Bary Castilla    Allergies  No Known Allergies  History of Present Illness    Mary Christian is a 59 y.o. female with a hx of HTN, anxiety, remote tobacco abuse last seen 09/26/2018 by Dr. Rockey Situ.  Additional medical history includes acute renal failure from dehydration in 2013, UTI in June 2013. Of note, previously had LE edema with Amlodipine 27m daily, but resolved with dose reduction to 5mg  daily.  Has since that time tolerated an increased dose.  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  Denies chest pain, pressure, tightness.  Reports no SOB nor DOE.  No orthopnea, PND.  She reports she gets lower extremity edema only in the summer months when it is very warm out and this is not bothersome to her.    She does not check her blood pressure at home.  Tells me she ran out of her amlodipine about a week ago and has not been taking it since-a refill has already been ordered.  It has been 1 year since her last labs and she requests to have them checked today.  EKGs/Labs/Other Studies Reviewed:   The following studies were reviewed today:  EKG:  EKG is ordered today.  The ekg ordered today demonstrates SR 78 bpm with no acute ST/T wave changes.   Recent Labs: No results found for requested labs within last 8760 hours.  Recent Lipid Panel    Component Value Date/Time   CHOL 249 (H) 10/03/2018 1001   TRIG 209 (H) 10/03/2018 1001   HDL 63 10/03/2018 1001   CHOLHDL 4.0 10/03/2018 1001   LDLCALC 144 (H) 10/03/2018 1001    Home Medications   Current Meds  Medication Sig  . ALPRAZolam (XANAX) 0.5 MG tablet TAKE 0.5-1 TABLET BY MOUTH DAILY AS NEEDED FOR SYMPTOMS  . amLODipine (NORVASC) 10 MG tablet TAKE 1 TABLET BY MOUTH EVERY DAY  . CALCIUM  CITRATE-VITAMIN D PO Take 630 mg by mouth daily.  . cetirizine (ZYRTEC) 10 MG tablet Take 10 mg by mouth daily.  Marland Kitchen losartan (COZAAR) 100 MG tablet Take 1 tablet (100 mg total) by mouth daily.  Marland Kitchen omeprazole (PRILOSEC) 20 MG capsule Take 20 mg by mouth daily.  Marland Kitchen PARoxetine (PAXIL) 20 MG tablet Take 1 tablet (20 mg total) by mouth daily.    Review of Systems   Review of Systems  Constitution: Negative for chills, fever and malaise/fatigue.  Cardiovascular: Negative for chest pain, dyspnea on exertion, leg swelling, near-syncope, orthopnea, palpitations and syncope.  Respiratory: Negative for cough, shortness of breath and wheezing.   Gastrointestinal: Negative for nausea and vomiting.   Neurological: Negative for dizziness, light-headedness and weakness.   All other systems reviewed and are otherwise negative except as noted above.  Physical Exam    VS:  BP (!) 146/84 (BP Location: Left Arm, Patient Position: Sitting, Cuff Size: Normal)   Pulse 78   Ht 5\' 5"  (1.651 m)   Wt 170 lb (77.1 kg)   LMP 06/16/2016   SpO2 96%   BMI 28.29 kg/m  , BMI Body mass index is 28.29 kg/m. GEN: Well nourished, well developed, in no acute distress. HEENT: normal. Neck: Supple, no JVD, carotid bruits, or masses. Cardiac: RRR, no murmurs, rubs, or gallops. No clubbing, cyanosis, edema.  Radials/DP/PT 2+ and equal bilaterally.  Respiratory:  Respirations regular and unlabored, clear to auscultation bilaterally. GI: Soft, nontender, nondistended, BS + x 4. MS: No deformity or atrophy. Skin: Warm and dry, no rash. Neuro:  Strength and sensation are intact. Psych: Normal affect.  Accessory Clinical Findings    ECG personally reviewed by me today - SR 78 bpm with no acute ST/T wave changse - no acute changes.  Assessment & Plan    1. HTN - BP elevated today. Has been out of her Amlodipine 10mg  for 1 week. Resume Amlodipine 10mg  daily. Continue Losartan 100mg  daily. She will report BP consistently >130/80 after resuming her medications. CMET, CBC, TSH today.  For monitoring of kidney, liver, etc on medications.  TSH to rule out thyroid abnormality as cause of recent increased BP.  2. Anxiety - Follows with [employer], [place].  Has an appointment next week.  We discussed the increase stress could be contributory to elevated blood pressures.  3. LE edema - Reports minimal LE edema during summer months only. No edema on exam. Recommend low sodium diet and elevating lower extremities when sitting.   4. Overweight - Regular cardiovascular exercise encouraged. Hemoglobin A1c today.   5. Lipid screening - >1 year since last lipid panel. Lipid panel today. LDL goal <100.  Disposition:  Follow up in 1 year(s) with Dr. Rockey Situ or APP  Loel Dubonnet, NP 10/08/2019, 11:26 AM

## 2019-10-08 ENCOUNTER — Other Ambulatory Visit: Payer: Self-pay

## 2019-10-08 ENCOUNTER — Ambulatory Visit (INDEPENDENT_AMBULATORY_CARE_PROVIDER_SITE_OTHER): Payer: 59 | Admitting: Family

## 2019-10-08 ENCOUNTER — Encounter: Payer: Self-pay | Admitting: Family

## 2019-10-08 VITALS — BP 146/84 | HR 78 | Ht 65.0 in | Wt 170.0 lb

## 2019-10-08 DIAGNOSIS — I10 Essential (primary) hypertension: Secondary | ICD-10-CM

## 2019-10-08 DIAGNOSIS — Z13220 Encounter for screening for lipoid disorders: Secondary | ICD-10-CM | POA: Diagnosis not present

## 2019-10-08 DIAGNOSIS — E663 Overweight: Secondary | ICD-10-CM

## 2019-10-08 DIAGNOSIS — F419 Anxiety disorder, unspecified: Secondary | ICD-10-CM

## 2019-10-08 MED ORDER — AMLODIPINE BESYLATE 10 MG PO TABS: 10.0000 mg | ORAL_TABLET | Freq: Every day | ORAL | 3 refills | Status: DC

## 2019-10-08 NOTE — Patient Instructions (Signed)
Medication Instructions:  No medication changes today.   *If you need a refill on your cardiac medications before your next appointment, please call your pharmacy*  Lab Work: Your physician recommends that you return for lab work today:  CMET, TSH, Hemoglobin A1c, CBC, Lipid panel  If you have labs (blood work) drawn today and your tests are completely normal, you will receive your results only by: Marland Kitchen MyChart Message (if you have MyChart) OR . A paper copy in the mail If you have any lab test that is abnormal or we need to change your treatment, we will call you to review the results.  Testing/Procedures: You had an EKG today. It showed normal sinus rhythm. This is a good result.   Follow-Up: At [facility], you and your health needs are our priority.  As part of our continuing mission to provide you with exceptional heart care, we have created designated Provider Care Teams.  These Care Teams include your primary Cardiologist (physician) and Advanced Practice Providers (APPs -  Physician Assistants and Nurse Practitioners) who all work together to provide you with the care you need, when you need it.  Your next appointment:   1 year(s)  The format for your next appointment:   In Person  Provider:   Ida Rogue, MD  Other Instructions  Blood pressure goal is less than 130/80. If you notice your numbers are still high after restarting your Amlodipine, please call and let us know.

## 2019-10-09 ENCOUNTER — Telehealth: Payer: Self-pay | Admitting: Family

## 2019-10-09 ENCOUNTER — Encounter: Payer: Self-pay | Admitting: Family

## 2019-10-09 DIAGNOSIS — Z13220 Encounter for screening for lipoid disorders: Secondary | ICD-10-CM

## 2019-10-09 LAB — CBC WITH DIFFERENTIAL/PLATELET
Basophils Absolute: 0 10*3/uL (ref 0.0–0.2)
Basos: 1 %
EOS (ABSOLUTE): 0.1 10*3/uL (ref 0.0–0.4)
Eos: 4 %
Hematocrit: 43.3 % (ref 34.0–46.6)
Hemoglobin: 15.4 g/dL (ref 11.1–15.9)
Immature Grans (Abs): 0 10*3/uL (ref 0.0–0.1)
Immature Granulocytes: 1 %
Lymphocytes Absolute: 1 10*3/uL (ref 0.7–3.1)
Lymphs: 26 %
MCH: 34.5 pg — ABNORMAL HIGH (ref 26.6–33.0)
MCHC: 35.6 g/dL (ref 31.5–35.7)
MCV: 97 fL (ref 79–97)
Monocytes Absolute: 0.5 10*3/uL (ref 0.1–0.9)
Monocytes: 13 %
Neutrophils Absolute: 2.2 10*3/uL (ref 1.4–7.0)
Neutrophils: 55 %
Platelets: 245 10*3/uL (ref 150–450)
RBC: 4.47 x10E6/uL (ref 3.77–5.28)
RDW: 13.4 % (ref 11.7–15.4)
WBC: 3.8 10*3/uL (ref 3.4–10.8)

## 2019-10-09 LAB — COMPREHENSIVE METABOLIC PANEL
ALT: 30 IU/L (ref 0–32)
AST: 30 IU/L (ref 0–40)
Albumin/Globulin Ratio: 1.5 (ref 1.2–2.2)
Albumin: 4.8 g/dL (ref 3.8–4.9)
Alkaline Phosphatase: 93 IU/L (ref 39–117)
BUN/Creatinine Ratio: 14 (ref 9–23)
BUN: 14 mg/dL (ref 6–24)
Bilirubin Total: 0.6 mg/dL (ref 0.0–1.2)
CO2: 20 mmol/L (ref 20–29)
Calcium: 11.4 mg/dL — ABNORMAL HIGH (ref 8.7–10.2)
Chloride: 103 mmol/L (ref 96–106)
Creatinine, Ser: 0.97 mg/dL (ref 0.57–1.00)
GFR calc Af Amer: 75 mL/min/{1.73_m2} (ref >59)
GFR calc non Af Amer: 65 mL/min/{1.73_m2} (ref >59)
Globulin, Total: 3.1 g/dL (ref 1.5–4.5)
Glucose: 95 mg/dL (ref 65–99)
Potassium: 4.8 mmol/L (ref 3.5–5.2)
Sodium: 140 mmol/L (ref 134–144)
Total Protein: 7.9 g/dL (ref 6.0–8.5)

## 2019-10-09 LAB — LIPID PANEL
Chol/HDL Ratio: 3.6 ratio (ref 0.0–4.4)
Cholesterol, Total: 261 mg/dL — ABNORMAL HIGH (ref 100–199)
HDL: 73 mg/dL (ref >39)
LDL Chol Calc (NIH): 160 mg/dL — ABNORMAL HIGH (ref 0–99)
Triglycerides: 158 mg/dL — ABNORMAL HIGH (ref 0–149)
VLDL Cholesterol Cal: 28 mg/dL (ref 5–40)

## 2019-10-09 LAB — HEMOGLOBIN A1C
Est. average glucose Bld gHb Est-mCnc: 103 mg/dL
Hgb A1c MFr Bld: 5.2 % (ref 4.8–5.6)

## 2019-10-09 LAB — TSH: TSH: 2.38 u[IU]/mL (ref 0.450–4.500)

## 2019-10-09 MED ORDER — ROSUVASTATIN CALCIUM 5 MG PO TABS: 5.0000 mg | ORAL_TABLET | Freq: Every day | ORAL | 3 refills | Status: DC

## 2019-10-09 NOTE — Telephone Encounter (Signed)
Patient calling to discuss recent testing results  ° °Please call  ° °

## 2019-10-09 NOTE — Telephone Encounter (Signed)
Reviewed results and recommendations with patient in detail and she verbalized understanding with no further questions at this time. She was agreeable to start rosuvastatin 5 mg once daily and recheck labs in 6 weeks at the [facility] entrance. Let her know that she should not eat or drink after midnight prior except water with pills. She verbalized understanding of all information with no further questions at this time.

## 2019-10-09 NOTE — Telephone Encounter (Signed)
-----   Message from Loel Dubonnet, NP sent at 10/09/2019  9:19 AM EST ----- Normal kidney and liver function. Calcium mildly elevated, not of concern. Blood counts with no evidence of anemia, good result. Thyroid function normal. A1c of 5.2 is good - no evidence of prediabetes/diabetes.   Given her family history of heart disease and her personal history of hypertension would like her LDL to be less than 100. It is presently 160. Would recommend starting Rosuvastatin (Crestor) 5mg  daily and recheck lipid/liver in 6 weeks.   I have forwarded results to [place] who she has an appt with next week. I left a voicemail on her cell phone 10/09/19 at 0919 and asked her to call us back to discuss cholesterol.  I have sent info on a lipid lowering diet to her MyChart.

## 2019-10-16 NOTE — Progress Notes (Signed)
PCP: Mary Banana., MD   Chief Complaint  Patient presents with  . Gynecologic Exam    HPI:      [name] is a 59 y.o. No obstetric history on file. who LMP was Patient's last menstrual period was 06/16/2016., presents today for her annual examination.  Her menses are absent due to menopause. Irreg bleeding resolved after endocervical leio removed 2017 with Dr. Glennon Christian. She does not have PMB.  She does have vasomotor sx. Sx tolerable with paxil 20 mg use. She does take xanax sporadically for episodic anxiety. Needs Rx RF.  Sex activity: single partner, contraception - post menopausal status. She does not have vaginal dryness.  Last Pap: 09/25/18 Results were: no abnormalities /neg HPV DNA.  Hx of STDs: none  Last mammogram: 07/31/19  Results were: cat 3 due to RT breast calcifications. Repeat dx mamm due in 6 months.  There is no FH of breast cancer. There is a FH of ovarian cancer in her pat aunt. FH colon cancer in both her parents.  Pt is MyRisk neg 2016. The patient does do self-breast exams.  Colonoscopy: colonoscopy 9/20 with Dr. Allen Christian with pre-cancerous polyps. Repeat due after 5 years.  FH of colon cancer in mother and father.  Tobacco use: The patient denies current or previous tobacco use. Alcohol use: social drinker  No drug use Exercise: moderately active  She does get adequate calcium and Vitamin D in her diet.  Fasting labs with PCP.  Past Medical History:  Diagnosis Date  . Abnormal vaginal bleeding   . Anxiety   . 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 GERD (gastroesophageal reflux disease)   . Hiatal hernia   . Hypertension   . Renal insufficiency     Past Surgical History:  Procedure Laterality Date  . CESAREAN SECTION    . COLONOSCOPY  08/04/2006   Dr Mary Christian  . COLONOSCOPY WITH PROPOFOL N/A 06/15/2019   Procedure: COLONOSCOPY WITH Biopsy;  Surgeon: Mary Lame, MD;  Location: [place];  Service: Endoscopy;   Laterality: N/A;  . DILATATION & CURETTAGE/HYSTEROSCOPY WITH MYOSURE N/A 03/05/2016   Procedure: DILATATION & CURETTAGE/HYSTEROSCOPY WITH MYOSURE;  Surgeon: Mary Bonnet, MD;  Location: [facility];  Service: Gynecology;  Laterality: N/A;  . DILATION AND CURETTAGE OF UTERUS    . POLYPECTOMY N/A 06/15/2019   Procedure: POLYPECTOMY;  Surgeon: Mary Lame, MD;  Location: [place];  Service: Endoscopy;  Laterality: N/A;  Clip x1  placed as site of Ascending Colon Polyp Removal  . TUBAL LIGATION    . UPPER GI ENDOSCOPY  08/04/2006   Dr Mary Christian    Family History  Problem Relation Age of Onset  . Hypertension Father   . Heart disease Father        CABG x 3  . Heart attack Father 69  . Colon cancer Father   . Liver cancer Father   . Hypertension Sister   . Hyperlipidemia Sister   . Skin cancer Sister        54s  . Hypertension Brother   . Hypertension Sister   . Hyperlipidemia Sister   . Hypertension Mother   . Colon cancer Mother 26       with mets.  . Heart Problems Maternal Grandfather   . Colon cancer Paternal Grandfather   . Ovarian cancer Paternal Aunt 35  . Breast cancer Neg Hx     Social History   Socioeconomic History  .  Marital status: Married    Spouse name: Not on file  . Number of children: Not on file  . Years of education: Not on file  . Highest education level: Not on file  Occupational History  . Not on file  Tobacco Use  . Smoking status: Former Smoker    Packs/day: 1.00    Years: 3.00    Pack years: 3.00    Types: Cigarettes    Quit date: 05/01/1985    Years since quitting: 34.4  . Smokeless tobacco: Never Used  Substance and Sexual Activity  . Alcohol use: Yes    Alcohol/week: 14.0 standard drinks    Types: 14 Glasses of wine per week  . Drug use: No  . Sexual activity: Yes    Birth control/protection: Post-menopausal  Other Topics Concern  . Not on file  Social History Narrative  . Not on file   Social Determinants of Health    Financial Resource Strain:   . Difficulty of Paying Living Expenses: Not on file  Food Insecurity:   . Worried About Charity fundraiser in the Last Year: Not on file  . Ran Out of Food in the Last Year: Not on file  Transportation Needs:   . Lack of Transportation (Medical): Not on file  . Lack of Transportation (Non-Medical): Not on file  Physical Activity:   . Days of Exercise per Week: Not on file  . Minutes of Exercise per Session: Not on file  Stress:   . Feeling of Stress : Not on file  Social Connections:   . Frequency of Communication with Friends and Family: Not on file  . Frequency of Social Gatherings with Friends and Family: Not on file  . Attends Religious Services: Not on file  . Active Member of Clubs or Organizations: Not on file  . Attends Archivist Meetings: Not on file  . Marital Status: Not on file  Intimate Partner Violence:   . Fear of Current or Ex-Partner: Not on file  . Emotionally Abused: Not on file  . Physically Abused: Not on file  . Sexually Abused: Not on file    Current Meds  Medication Sig  . ALPRAZolam (XANAX) 0.5 MG tablet Take 1 tablet (0.5 mg total) by mouth 2 (two) times daily as needed for anxiety.  Marland Kitchen amLODipine (NORVASC) 10 MG tablet Take 1 tablet (10 mg total) by mouth daily.  Marland Kitchen CALCIUM CITRATE-VITAMIN D PO Take 630 mg by mouth daily.  . cetirizine (ZYRTEC) 10 MG tablet Take 10 mg by mouth daily.  Marland Kitchen losartan (COZAAR) 100 MG tablet Take 1 tablet (100 mg total) by mouth daily.  Marland Kitchen omeprazole (PRILOSEC) 20 MG capsule Take 20 mg by mouth daily.  Marland Kitchen PARoxetine (PAXIL) 20 MG tablet Take 1 tablet (20 mg total) by mouth daily.  . rosuvastatin (CRESTOR) 5 MG tablet Take 1 tablet (5 mg total) by mouth daily.  . [DISCONTINUED] ALPRAZolam (XANAX) 0.5 MG tablet TAKE 0.5-1 TABLET BY MOUTH DAILY AS NEEDED FOR SYMPTOMS  . [DISCONTINUED] PARoxetine (PAXIL) 20 MG tablet Take 1 tablet (20 mg total) by mouth daily.      ROS:  Review of  Systems  Constitutional: Negative for fatigue, fever and unexpected weight change.  Respiratory: Negative for cough, shortness of breath and wheezing.   Cardiovascular: Negative for chest pain, palpitations and leg swelling.  Gastrointestinal: Negative for blood in stool, constipation, diarrhea, nausea and vomiting.  Endocrine: Negative for cold intolerance, heat intolerance and polyuria.  Genitourinary: Negative for dyspareunia, dysuria, flank pain, frequency, genital sores, hematuria, menstrual problem, pelvic pain, urgency, vaginal bleeding, vaginal discharge and vaginal pain.  Musculoskeletal: Negative for back pain, joint swelling and myalgias.  Skin: Negative for rash.  Neurological: Negative for dizziness, syncope, light-headedness, numbness and headaches.  Hematological: Negative for adenopathy.  Psychiatric/Behavioral: Negative for agitation, confusion, sleep disturbance and suicidal ideas. The patient is not nervous/anxious.      Objective: BP (!) 144/80   Ht 5\' 5"  (1.651 m)   Wt 170 lb (77.1 kg)   LMP 06/16/2016   BMI 28.29 kg/m    Physical Exam Constitutional:      Appearance: She is well-developed.  Genitourinary:     Vulva, vagina, uterus, right adnexa and left adnexa normal.     No vulval lesion or tenderness noted.     No vaginal discharge, erythema or tenderness.     No cervical motion tenderness or polyp.     Uterus is not enlarged or tender.     No right or left adnexal mass present.     Right adnexa not tender.     Left adnexa not tender.  Neck:     Thyroid: No thyromegaly.  Cardiovascular:     Rate and Rhythm: Normal rate and regular rhythm.     Heart sounds: Normal heart sounds. No murmur.  Pulmonary:     Effort: Pulmonary effort is normal.     Breath sounds: Normal breath sounds.  Chest:     Breasts:        Right: No mass, nipple discharge, skin change or tenderness.        Left: No mass, nipple discharge, skin change or tenderness.  Abdominal:      Palpations: Abdomen is soft.     Tenderness: There is no abdominal tenderness. There is no guarding.  Musculoskeletal:        General: Normal range of motion.     Cervical back: Normal range of motion.  Neurological:     General: No focal deficit present.     Mental Status: She is alert and oriented to person, place, and time.     Cranial Nerves: No cranial nerve deficit.  Skin:    General: Skin is warm and dry.  Psychiatric:        Mood and Affect: Mood normal.        Behavior: Behavior normal.        Thought Content: Thought content normal.        Judgment: Judgment normal.  Vitals reviewed.     Assessment/Plan:  Encounter for annual routine gynecological examination  Encounter for screening mammogram for malignant neoplasm of breast  Breast calcification seen on mammogram - Plan: MM DIAG BREAST TOMO UNI RIGHT; pt due for RT breast dx mammo 5/21.  Family history of colon cancer--Pt is MyRisk neg. Followed by GI for colonoscopies.  Anxiety - Rx RF xanax prn, use sparingly. - Plan: ALPRAZolam (XANAX) 0.5 MG tablet, PARoxetine (PAXIL) 20 MG tablet  Vasomotor symptoms due to menopause - Rx RF paxil for sx/anxiety. F/u prn - Plan: PARoxetine (PAXIL) 20 MG tablet   Meds ordered this encounter  Medications  . ALPRAZolam (XANAX) 0.5 MG tablet    Sig: Take 1 tablet (0.5 mg total) by mouth 2 (two) times daily as needed for anxiety.    Dispense:  30 tablet    Refill:  0    Not to exceed 4 additional fills before 11/25/2019    Order  Specific Question:   Supervising Provider    Answer:   Gae Dry [id number]  . PARoxetine (PAXIL) 20 MG tablet    Sig: Take 1 tablet (20 mg total) by mouth daily.    Dispense:  90 tablet    Refill:  3    Order Specific Question:   Supervising Provider    Answer:   Gae Dry [id number]            GYN counsel breast self exam, mammography screening, menopause, adequate intake of calcium and vitamin D, diet and exercise     F/U  Return in about 1 year (around 10/16/2020).  Alicia B. Copland, PA-C 10/17/2019 11:26 AM

## 2019-10-17 ENCOUNTER — Encounter: Payer: Self-pay | Admitting: Obstetrics and Gynecology

## 2019-10-17 ENCOUNTER — Other Ambulatory Visit: Payer: Self-pay

## 2019-10-17 ENCOUNTER — Ambulatory Visit (INDEPENDENT_AMBULATORY_CARE_PROVIDER_SITE_OTHER): Payer: No Typology Code available for payment source | Admitting: Obstetrics and Gynecology

## 2019-10-17 VITALS — BP 144/80 | Ht 65.0 in | Wt 170.0 lb

## 2019-10-17 DIAGNOSIS — Z800 Family history of malignant neoplasm of digestive organs: Secondary | ICD-10-CM

## 2019-10-17 DIAGNOSIS — R921 Mammographic calcification found on diagnostic imaging of breast: Secondary | ICD-10-CM

## 2019-10-17 DIAGNOSIS — Z1231 Encounter for screening mammogram for malignant neoplasm of breast: Secondary | ICD-10-CM

## 2019-10-17 DIAGNOSIS — N951 Menopausal and female climacteric states: Secondary | ICD-10-CM

## 2019-10-17 DIAGNOSIS — Z01419 Encounter for gynecological examination (general) (routine) without abnormal findings: Secondary | ICD-10-CM

## 2019-10-17 DIAGNOSIS — F419 Anxiety disorder, unspecified: Secondary | ICD-10-CM

## 2019-10-17 MED ORDER — ALPRAZOLAM 0.5 MG PO TABS: 0.5000 mg | ORAL_TABLET | Freq: Two times a day (BID) | ORAL | 0 refills | Status: DC | PRN

## 2019-10-17 MED ORDER — PAROXETINE HCL 20 MG PO TABS: 20.0000 mg | ORAL_TABLET | Freq: Every day | ORAL | 3 refills | Status: DC

## 2019-10-17 NOTE — Patient Instructions (Signed)
I value your feedback and entrusting us with your care. If you get a [facility] patient survey, I would appreciate you taking the time to let us know about your experience today. Thank you! ° °As of August 30, 2019, your lab results will be released to your MyChart immediately, before I even have a chance to see them. Please give me time to review them and contact you if there are any abnormalities. Thank you for your patience.  ° °[facility]: [phone] ° ° ° °

## 2019-11-21 ENCOUNTER — Encounter: Payer: Self-pay | Admitting: Obstetrics and Gynecology

## 2019-11-22 ENCOUNTER — Other Ambulatory Visit: Payer: Self-pay | Admitting: Obstetrics and Gynecology

## 2019-11-22 DIAGNOSIS — N951 Menopausal and female climacteric states: Secondary | ICD-10-CM

## 2019-11-22 DIAGNOSIS — F419 Anxiety disorder, unspecified: Secondary | ICD-10-CM

## 2019-11-22 MED ORDER — PAROXETINE HCL 40 MG PO TABS: 40.0000 mg | ORAL_TABLET | Freq: Every day | ORAL | 1 refills | Status: DC

## 2019-11-22 NOTE — Progress Notes (Signed)
Increase paxil for increased anxiety with fam stressors. Rx changed. F/u prn.

## 2019-12-05 ENCOUNTER — Other Ambulatory Visit: Payer: Self-pay | Admitting: Cardiovascular Disease

## 2020-01-14 ENCOUNTER — Other Ambulatory Visit: Payer: Self-pay | Admitting: Obstetrics and Gynecology

## 2020-01-14 DIAGNOSIS — F419 Anxiety disorder, unspecified: Secondary | ICD-10-CM

## 2020-01-14 NOTE — Telephone Encounter (Signed)
Please advise 

## 2020-01-21 ENCOUNTER — Encounter: Payer: Self-pay | Admitting: Obstetrics and Gynecology

## 2020-01-22 ENCOUNTER — Other Ambulatory Visit: Payer: Self-pay | Admitting: Obstetrics and Gynecology

## 2020-01-22 DIAGNOSIS — F419 Anxiety disorder, unspecified: Secondary | ICD-10-CM

## 2020-01-22 DIAGNOSIS — N951 Menopausal and female climacteric states: Secondary | ICD-10-CM

## 2020-01-22 MED ORDER — PAROXETINE HCL 20 MG PO TABS: 20.0000 mg | ORAL_TABLET | Freq: Every day | ORAL | 1 refills | Status: DC

## 2020-01-22 NOTE — Progress Notes (Signed)
Rx decrease to paxil 20 mg due to feeling groggy with 40 mg dose.

## 2020-02-06 ENCOUNTER — Ambulatory Visit
Admission: RE | Admit: 2020-02-06 | Discharge: 2020-02-06 | Disposition: A | Discharge status: Home / Self Care | Payer: 59 | Source: Ambulatory Visit | Attending: Obstetrics and Gynecology | Admitting: Obstetrics and Gynecology

## 2020-02-06 DIAGNOSIS — R921 Mammographic calcification found on diagnostic imaging of breast: Secondary | ICD-10-CM

## 2020-02-19 ENCOUNTER — Encounter: Payer: Self-pay | Admitting: Family Medicine

## 2020-02-20 NOTE — Progress Notes (Signed)
Trena Platt Cummings,acting as a scribe for Wilhemena Durie, MD.,have documented all relevant documentation on the behalf of Wilhemena Durie, MD,as directed by  Wilhemena Durie, MD while in the presence of Wilhemena Durie, MD.  Established patient visit   Patient: Mary Christian   DOB: 03/11/1961   59 y.o. Female  MRN: [id number] Visit Date: 02/21/2020  Today's healthcare provider: Wilhemena Durie, MD   Chief Complaint  Patient presents with  . Dysuria   Subjective    Dysuria  This is a new problem. The current episode started in the past 7 days. The quality of the pain is described as burning. The pain is at a severity of 5/10. There has been no fever. Associated symptoms include hesitancy and urgency. Pertinent negatives include no chills, discharge, flank pain, frequency, hematuria, nausea or vomiting. She has tried home medications (AZO ) for the symptoms. The treatment provided mild relief.   Patient is also complaining of right knee pain, that started this weekend. Patient is having a hard time walking on right leg, does not know how she hurt her knee.  Pain worse with weightbearing.  Patient states she has cut back on her drinking since her last visit with me in the fall.      Medications: Outpatient Medications Prior to Visit  Medication Sig  . ALPRAZolam (XANAX) 0.5 MG tablet TAKE 1 TABLET BY MOUTH TWICE A DAY AS NEEDED FOR ANXIETY  . amLODipine (NORVASC) 10 MG tablet Take 1 tablet (10 mg total) by mouth daily.  Marland Kitchen CALCIUM CITRATE-VITAMIN D PO Take 630 mg by mouth daily.  . cetirizine (ZYRTEC) 10 MG tablet Take 10 mg by mouth daily.  Marland Kitchen losartan (COZAAR) 100 MG tablet TAKE 1 TABLET BY MOUTH DAILY  . omeprazole (PRILOSEC) 20 MG capsule Take 20 mg by mouth daily.  Marland Kitchen PARoxetine (PAXIL) 20 MG tablet Take 1 tablet (20 mg total) by mouth daily.  . rosuvastatin (CRESTOR) 5 MG tablet Take 1 tablet (5 mg total) by mouth daily.   No facility-administered medications  prior to visit.    Review of Systems  Constitutional: Negative for appetite change, chills, fatigue and fever.  Respiratory: Negative for chest tightness and shortness of breath.   Cardiovascular: Negative for chest pain and palpitations.  Gastrointestinal: Negative for abdominal pain, nausea and vomiting.  Endocrine: Negative.   Genitourinary: Positive for dysuria, hesitancy and urgency. Negative for flank pain, frequency and hematuria.  Musculoskeletal: Positive for arthralgias.  Allergic/Immunologic: Negative.   Neurological: Negative for dizziness and weakness.  Hematological: Negative.   Psychiatric/Behavioral: Negative.        Objective    BP 136/81 (BP Location: Right Arm, Patient Position: Sitting, Cuff Size: Large)   Pulse 84   Temp (!) 97.3 F (36.3 C) (Temporal)   Ht 5\' 5"  (1.651 m)   Wt 174 lb 9.6 oz (79.2 kg)   LMP 06/16/2016   BMI 29.05 kg/m  BP Readings from Last 3 Encounters:  02/21/20 136/81  10/17/19 (!) 144/80  10/08/19 (!) 146/84   Wt Readings from Last 3 Encounters:  02/21/20 174 lb 9.6 oz (79.2 kg)  10/17/19 170 lb (77.1 kg)  10/08/19 170 lb (77.1 kg)      Physical Exam Vitals reviewed.  Constitutional:      Appearance: Normal appearance.  HENT:     Head: Normocephalic and atraumatic.     Right Ear: External ear normal.     Left Ear: External ear normal.  Nose: Nose normal.     Mouth/Throat:     Pharynx: Oropharynx is clear.  Eyes:     General: No scleral icterus. Cardiovascular:     Rate and Rhythm: Normal rate and regular rhythm.     Heart sounds: Normal heart sounds.  Pulmonary:     Effort: Pulmonary effort is normal.     Breath sounds: Normal breath sounds.  Abdominal:     Palpations: Abdomen is soft.     Tenderness: There is no right CVA tenderness or left CVA tenderness.  Musculoskeletal:     Comments: Mild medial tenderness and swelling of the right knee.  Tenderness is over the right MCL and joint line.  Skin:     General: Skin is warm and dry.  Neurological:     General: No focal deficit present.     Mental Status: She is alert and oriented to person, place, and time.  Psychiatric:        Mood and Affect: Mood normal.        Behavior: Behavior normal.        Thought Content: Thought content normal.        Judgment: Judgment normal.       No results found for any visits on 02/21/20.  Assessment & Plan     1. Dysuria  - POCT urinalysis dipstick - Urine Culture  2. Acute cystitis without hematuria Advised to postcoital voiding.  Also advised to use cranberry juice with any symptoms. - Urine Culture - nitrofurantoin, macrocrystal-monohydrate, (MACROBID) 100 MG capsule; Take 1 capsule (100 mg total) by mouth 2 (two) times daily.  Dispense: 10 capsule; Refill: 1  3. Acute pain of right knee Right knee versus bursitis versus sprain.  No further intervention other anti-inflammatories for a couple of weeks.  Imaging or referral in the future if necessary. - celecoxib (CELEBREX) 200 MG capsule; Take 1 capsule (200 mg total) by mouth daily as needed.  Dispense: 30 capsule; Refill: 4   No follow-ups on file.      I, Wilhemena Durie, MD, have reviewed all documentation for this visit. The documentation on 02/24/20 for the exam, diagnosis, procedures, and orders are all accurate and complete.    Sanvi Ehler Cranford Mon, MD  [facility] [phone] (phone) [phone] (fax)  [place]

## 2020-02-21 ENCOUNTER — Encounter: Payer: Self-pay | Admitting: Family Medicine

## 2020-02-21 ENCOUNTER — Other Ambulatory Visit: Payer: Self-pay

## 2020-02-21 ENCOUNTER — Ambulatory Visit (INDEPENDENT_AMBULATORY_CARE_PROVIDER_SITE_OTHER): Payer: 59 | Admitting: Family Medicine

## 2020-02-21 VITALS — BP 136/81 | HR 84 | Temp 97.3°F | Ht 65.0 in | Wt 174.6 lb

## 2020-02-21 DIAGNOSIS — R300 Dysuria: Secondary | ICD-10-CM

## 2020-02-21 DIAGNOSIS — N3000 Acute cystitis without hematuria: Secondary | ICD-10-CM

## 2020-02-21 DIAGNOSIS — M25561 Pain in right knee: Secondary | ICD-10-CM | POA: Diagnosis not present

## 2020-02-21 LAB — POCT URINALYSIS DIPSTICK
Bilirubin, UA: NEGATIVE
Blood, UA: NEGATIVE
Glucose, UA: NEGATIVE
Ketones, UA: NEGATIVE
Nitrite, UA: POSITIVE
Protein, UA: NEGATIVE
Spec Grav, UA: 1.01 (ref 1.010–1.025)
Urobilinogen, UA: 0.2 E.U./dL
pH, UA: 7 (ref 5.0–8.0)

## 2020-02-21 MED ORDER — NITROFURANTOIN MONOHYD MACRO 100 MG PO CAPS: 100.0000 mg | ORAL_CAPSULE | Freq: Two times a day (BID) | ORAL | 1 refills | Status: DC

## 2020-02-21 MED ORDER — CELECOXIB 200 MG PO CAPS: 200.0000 mg | ORAL_CAPSULE | Freq: Every day | ORAL | 4 refills | Status: DC | PRN

## 2020-02-23 LAB — URINE CULTURE

## 2020-02-25 ENCOUNTER — Telehealth: Payer: Self-pay

## 2020-02-25 NOTE — Telephone Encounter (Signed)
-----   Message from Jerrol Banana., MD sent at 02/25/2020 11:08 AM EDT ----- Urine normal.Finish meds.

## 2020-02-25 NOTE — Telephone Encounter (Signed)
Patient advised.

## 2020-03-09 ENCOUNTER — Other Ambulatory Visit: Payer: Self-pay | Admitting: Obstetrics and Gynecology

## 2020-03-09 DIAGNOSIS — F419 Anxiety disorder, unspecified: Secondary | ICD-10-CM

## 2020-03-31 ENCOUNTER — Ambulatory Visit (INDEPENDENT_AMBULATORY_CARE_PROVIDER_SITE_OTHER): Payer: 59 | Admitting: Family Medicine

## 2020-03-31 ENCOUNTER — Encounter: Payer: Self-pay | Admitting: Family Medicine

## 2020-03-31 DIAGNOSIS — J3489 Other specified disorders of nose and nasal sinuses: Secondary | ICD-10-CM

## 2020-03-31 DIAGNOSIS — R05 Cough: Secondary | ICD-10-CM

## 2020-03-31 DIAGNOSIS — R059 Cough, unspecified: Secondary | ICD-10-CM

## 2020-03-31 MED ORDER — AMOXICILLIN 875 MG PO TABS: 875.0000 mg | ORAL_TABLET | Freq: Two times a day (BID) | ORAL | 0 refills | Status: DC

## 2020-03-31 NOTE — Progress Notes (Signed)
Virtual telephone visit    Virtual Visit via Telephone Note   This visit type was conducted due to national recommendations for restrictions regarding the COVID-19 Pandemic (e.g. social distancing) in an effort to limit this patient's exposure and mitigate transmission in our community. Due to her co-morbid illnesses, this patient is at least at moderate risk for complications without adequate follow up. This format is felt to be most appropriate for this patient at this time. The patient did not have access to video technology or had technical difficulties with video requiring transitioning to audio format only (telephone). Physical exam was limited to content and character of the telephone converstion.    Patient location: Home Provider location: Office   Visit Date: 03/31/2020  Today's healthcare provider: Vernie Murders, PA   Chief Complaint  Patient presents with  . Sinus Problem   Subjective    Sinus Problem This is a new problem. The current episode started 1 to 4 weeks ago. There has been no fever. She is experiencing no pain. Associated symptoms include congestion, coughing, ear pain, headaches, a hoarse voice and swollen glands. Pertinent negatives include no chills, neck pain, shortness of breath, sinus pressure, sneezing or sore throat. Treatments tried: mucinex cough. The treatment provided no relief.    Patient says that her cough is 10x worse at night.   Past Medical History:  Diagnosis Date  . Abnormal vaginal bleeding   . Anxiety   . 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 GERD (gastroesophageal reflux disease)   . Hiatal hernia   . Hypertension   . Renal insufficiency    Past Surgical History:  Procedure Laterality Date  . CESAREAN SECTION    . COLONOSCOPY  08/04/2006   Dr Bary Castilla  . COLONOSCOPY WITH PROPOFOL N/A 06/15/2019   Procedure: COLONOSCOPY WITH Biopsy;  Surgeon: Lucilla Lame, MD;  Location: [place];  Service: Endoscopy;   Laterality: N/A;  . DILATATION & CURETTAGE/HYSTEROSCOPY WITH MYOSURE N/A 03/05/2016   Procedure: DILATATION & CURETTAGE/HYSTEROSCOPY WITH MYOSURE;  Surgeon: Will Bonnet, MD;  Location: [facility];  Service: Gynecology;  Laterality: N/A;  . DILATION AND CURETTAGE OF UTERUS    . POLYPECTOMY N/A 06/15/2019   Procedure: POLYPECTOMY;  Surgeon: Lucilla Lame, MD;  Location: [place];  Service: Endoscopy;  Laterality: N/A;  Clip x1  placed as site of Ascending Colon Polyp Removal  . TUBAL LIGATION    . UPPER GI ENDOSCOPY  08/04/2006   Dr Bary Castilla   Social History   Tobacco Use  . Smoking status: Former Smoker    Packs/day: 1.00    Years: 3.00    Pack years: 3.00    Types: Cigarettes    Quit date: 05/01/1985    Years since quitting: 34.9  . Smokeless tobacco: Never Used  Vaping Use  . Vaping Use: Never used  Substance Use Topics  . Alcohol use: Yes    Alcohol/week: 14.0 standard drinks    Types: 14 Glasses of wine per week  . Drug use: No   Family Status  Relation Name Status  . Father  Deceased  . Sister  Alive  . Brother  Alive  . Sister  Alive  . Mother  Alive  . Daughter  Alive  . Son  Alive  . Daughter  Alive  . MGF  (Not Specified)  . PGF  (Not Specified)  . Ethlyn Daniels  Deceased  . Neg Hx  (Not Specified)   No Known  Allergies    Medications: Outpatient Medications Prior to Visit  Medication Sig  . ALPRAZolam (XANAX) 0.5 MG tablet TAKE 1 TABLET BY MOUTH TWICE A DAY AS NEEDED FOR ANXIETY  . amLODipine (NORVASC) 10 MG tablet Take 1 tablet (10 mg total) by mouth daily.  Marland Kitchen CALCIUM CITRATE-VITAMIN D PO Take 630 mg by mouth daily.  . celecoxib (CELEBREX) 200 MG capsule Take 1 capsule (200 mg total) by mouth daily as needed.  . cetirizine (ZYRTEC) 10 MG tablet Take 10 mg by mouth daily.  Marland Kitchen losartan (COZAAR) 100 MG tablet TAKE 1 TABLET BY MOUTH DAILY  . nitrofurantoin, macrocrystal-monohydrate, (MACROBID) 100 MG capsule Take 1 capsule (100 mg total) by mouth 2  (two) times daily.  Marland Kitchen omeprazole (PRILOSEC) 20 MG capsule Take 20 mg by mouth daily.  Marland Kitchen PARoxetine (PAXIL) 20 MG tablet Take 1 tablet (20 mg total) by mouth daily.  . rosuvastatin (CRESTOR) 5 MG tablet Take 1 tablet (5 mg total) by mouth daily.   No facility-administered medications prior to visit.    Review of Systems  Constitutional: Negative for chills.  HENT: Positive for congestion, ear pain and hoarse voice. Negative for sinus pressure, sneezing and sore throat.   Respiratory: Positive for cough. Negative for shortness of breath.   Musculoskeletal: Negative for neck pain.  Neurological: Positive for headaches.    Objective    LMP 06/16/2016   Physical: Mild ticklish cough during telephonic interview.    Assessment & Plan     1. Sinus pressure Onset 3 days ago with burning pressure under eyes, PND and some cough. No loss of sense of taste or smell. Similar to bronchitis with sinusitis she had in March 2020. Will treat with Amoxil, Mucinex-DM and may add Zyrtec with Flonase. Recheck prn. - amoxicillin (AMOXIL) 875 MG tablet; Take 1 tablet (875 mg total) by mouth 2 (two) times daily.  Dispense: 20 tablet; Refill: 0  2. Cough Onset 03-28-20 with clear sputum production and slight wheeze. Mucinex-DM and Ibuprofen helps with controlling cough and sinus pressure. No shortness of breath or fever. Had COVID vaccinations on 12-18-19 and 01-08-20. Treat with Amoxicillin for early bronchitis. Recheck prn. - amoxicillin (AMOXIL) 875 MG tablet; Take 1 tablet (875 mg total) by mouth 2 (two) times daily.  Dispense: 20 tablet; Refill: 0   No follow-ups on file.    I discussed the assessment and treatment plan with the patient. The patient was provided an opportunity to ask questions and all were answered. The patient agreed with the plan and demonstrated an understanding of the instructions.   The patient was advised to call back or seek an in-person evaluation if the symptoms worsen or if  the condition fails to improve as anticipated.  I provided 17 minutes of non-face-to-face time during this encounter.  Andres Shad, PA, have reviewed all documentation for this visit. The documentation on 03/31/20 for the exam, diagnosis, procedures, and orders are all accurate and complete.   Vernie Murders, [facility] [phone] (phone) [phone] (fax)  [place]

## 2020-04-24 ENCOUNTER — Other Ambulatory Visit: Payer: Self-pay | Admitting: Family Medicine

## 2020-04-24 DIAGNOSIS — N3000 Acute cystitis without hematuria: Secondary | ICD-10-CM

## 2020-04-24 NOTE — Telephone Encounter (Signed)
Requested medication (s) are due for refill today: Yes  Requested medication (s) are on the active medication list: Yes  Last refill:  02/21/20  Future visit scheduled: No  Notes to clinic:  See request.    Requested Prescriptions  Pending Prescriptions Disp Refills   nitrofurantoin, macrocrystal-monohydrate, (MACROBID) 100 MG capsule [Pharmacy Med Name: NITROFURANTOIN MONO-MCR 100 MG] 10 capsule 1    Sig: TAKE 1 CAPSULE BY MOUTH TWICE A DAY      Off-Protocol Failed - 04/24/2020 10:18 AM      Failed - Medication not assigned to a protocol, review manually.      Passed - Valid encounter within last 12 months    Recent Outpatient Visits           3 weeks ago Sinus pressure   [name], [name]   2 months ago [name], MD   10 months ago Need for immunization against influenza   [name], MD   1 year ago Burning with urination   [name], MD   1 year ago Acute non-recurrent maxillary sinusitis   [name], [name], [name]

## 2020-04-29 ENCOUNTER — Other Ambulatory Visit: Payer: Self-pay | Admitting: Obstetrics and Gynecology

## 2020-04-29 DIAGNOSIS — F419 Anxiety disorder, unspecified: Secondary | ICD-10-CM

## 2020-04-29 MED ORDER — ALPRAZOLAM 0.5 MG PO TABS: 0.5000 mg | ORAL_TABLET | Freq: Two times a day (BID) | ORAL | 0 refills | Status: DC | PRN

## 2020-04-29 NOTE — Telephone Encounter (Signed)
Please advise if refill is appropriate 

## 2020-05-17 ENCOUNTER — Other Ambulatory Visit: Payer: Self-pay | Admitting: Family Medicine

## 2020-05-17 ENCOUNTER — Other Ambulatory Visit: Payer: Self-pay | Admitting: Obstetrics and Gynecology

## 2020-05-17 DIAGNOSIS — N3000 Acute cystitis without hematuria: Secondary | ICD-10-CM

## 2020-05-17 DIAGNOSIS — R059 Cough, unspecified: Secondary | ICD-10-CM

## 2020-05-17 DIAGNOSIS — J3489 Other specified disorders of nose and nasal sinuses: Secondary | ICD-10-CM

## 2020-05-17 DIAGNOSIS — F419 Anxiety disorder, unspecified: Secondary | ICD-10-CM

## 2020-05-17 NOTE — Telephone Encounter (Signed)
Requested medication (s) are due for refill today: yes  Requested medication (s) are on the active medication list: yes  Last refill:  03/31/20  Future visit scheduled: no  Notes to clinic:  med not assigned to a protocol   Requested Prescriptions  Pending Prescriptions Disp Refills   amoxicillin (AMOXIL) 875 MG tablet [Pharmacy Med Name: AMOXICILLIN 875 MG TABLET] 20 tablet 0    Sig: TAKE 1 TABLET BY MOUTH TWICE A DAY      Off-Protocol Failed - 05/17/2020 10:09 AM      Failed - Medication not assigned to a protocol, review manually.      Passed - Valid encounter within last 12 months    Recent Outpatient Visits           1 month ago Sinus pressure   [place], [name]   2 months ago [place] Jerrol Banana., MD   11 months ago Need for immunization against influenza   [facility] Jerrol Banana., MD   1 year ago Burning with urination   [facility] Jerrol Banana., MD   1 year ago Acute non-recurrent maxillary sinusitis   [facility] Chrismon, [name], [name]

## 2020-05-17 NOTE — Telephone Encounter (Signed)
Requested medication (s) are due for refill today: yes  Requested medication (s) are on the active medication list: yes  Last refill:  02/21/20  Future visit scheduled: no  Notes to clinic:  med not assigned to a protool   Requested Prescriptions  Pending Prescriptions Disp Refills   nitrofurantoin, macrocrystal-monohydrate, (MACROBID) 100 MG capsule [Pharmacy Med Name: NITROFURANTOIN MONO-MCR 100 MG] 10 capsule 1    Sig: TAKE 1 CAPSULE BY MOUTH TWICE A DAY      Off-Protocol Failed - 05/17/2020 10:09 AM      Failed - Medication not assigned to a protocol, review manually.      Passed - Valid encounter within last 12 months    Recent Outpatient Visits           1 month ago Sinus pressure   [place], [name]   2 months ago [place] Jerrol Banana., MD   11 months ago Need for immunization against influenza   [facility] Jerrol Banana., MD   1 year ago Burning with urination   [facility] Jerrol Banana., MD   1 year ago Acute non-recurrent maxillary sinusitis   [facility] Chrismon, [name], [name]

## 2020-05-19 NOTE — Telephone Encounter (Signed)
Patient says the request for a refill was a mistake.

## 2020-05-19 NOTE — Telephone Encounter (Signed)
She will need appt. With someone.

## 2020-05-19 NOTE — Telephone Encounter (Signed)
Please advise 

## 2020-07-15 ENCOUNTER — Other Ambulatory Visit: Payer: Self-pay | Admitting: Obstetrics and Gynecology

## 2020-07-15 DIAGNOSIS — R921 Mammographic calcification found on diagnostic imaging of breast: Secondary | ICD-10-CM

## 2020-08-01 ENCOUNTER — Ambulatory Visit
Admission: RE | Admit: 2020-08-01 | Discharge: 2020-08-01 | Disposition: A | Discharge status: Home / Self Care | Payer: 59 | Source: Ambulatory Visit | Attending: Obstetrics and Gynecology | Admitting: Obstetrics and Gynecology

## 2020-08-01 ENCOUNTER — Other Ambulatory Visit: Payer: Self-pay

## 2020-08-01 DIAGNOSIS — R921 Mammographic calcification found on diagnostic imaging of breast: Secondary | ICD-10-CM | POA: Diagnosis present

## 2020-08-02 ENCOUNTER — Encounter: Payer: Self-pay | Admitting: Obstetrics and Gynecology

## 2020-08-12 ENCOUNTER — Other Ambulatory Visit: Payer: Self-pay | Admitting: Obstetrics and Gynecology

## 2020-08-12 DIAGNOSIS — F419 Anxiety disorder, unspecified: Secondary | ICD-10-CM

## 2020-08-16 ENCOUNTER — Other Ambulatory Visit: Payer: Self-pay | Admitting: Obstetrics and Gynecology

## 2020-08-16 DIAGNOSIS — N951 Menopausal and female climacteric states: Secondary | ICD-10-CM

## 2020-08-16 DIAGNOSIS — F419 Anxiety disorder, unspecified: Secondary | ICD-10-CM

## 2020-09-05 ENCOUNTER — Other Ambulatory Visit: Payer: Self-pay | Admitting: Obstetrics and Gynecology

## 2020-09-05 DIAGNOSIS — F419 Anxiety disorder, unspecified: Secondary | ICD-10-CM

## 2020-09-05 NOTE — Telephone Encounter (Signed)
Please advise 

## 2020-09-07 ENCOUNTER — Other Ambulatory Visit: Payer: Self-pay | Admitting: Obstetrics and Gynecology

## 2020-09-07 DIAGNOSIS — N951 Menopausal and female climacteric states: Secondary | ICD-10-CM

## 2020-09-07 DIAGNOSIS — F419 Anxiety disorder, unspecified: Secondary | ICD-10-CM

## 2020-09-07 MED ORDER — ALPRAZOLAM 0.5 MG PO TABS: 0.5000 mg | ORAL_TABLET | Freq: Two times a day (BID) | ORAL | 0 refills | Status: DC | PRN

## 2020-09-08 NOTE — Telephone Encounter (Signed)
Please advise 

## 2020-09-22 ENCOUNTER — Other Ambulatory Visit: Payer: Self-pay | Admitting: *Deleted

## 2020-09-22 MED ORDER — LOSARTAN POTASSIUM 100 MG PO TABS: 100.0000 mg | ORAL_TABLET | Freq: Every day | ORAL | 0 refills | Status: DC

## 2020-09-22 NOTE — Telephone Encounter (Signed)
Received refill request from [facility] pharmacy stating patient was transferring to their pharmacy. Rx sent to pharmacy for #30 and 0 refills.

## 2020-09-23 ENCOUNTER — Other Ambulatory Visit: Payer: Self-pay | Admitting: Cardiovascular Disease

## 2020-09-24 ENCOUNTER — Other Ambulatory Visit: Payer: Self-pay | Admitting: Cardiovascular Disease

## 2020-10-01 ENCOUNTER — Encounter: Payer: Self-pay | Admitting: Obstetrics and Gynecology

## 2020-10-06 ENCOUNTER — Telehealth: Payer: Self-pay | Admitting: Cardiovascular Disease

## 2020-10-06 NOTE — Telephone Encounter (Signed)
  Patient Consent for Virtual Visit         Mary Christian has provided verbal consent on 10/06/2020 for a virtual visit (video or telephone).   CONSENT FOR VIRTUAL VISIT FOR:  Mary Christian  By participating in this virtual visit I agree to the following:  I hereby voluntarily request, consent and authorize [facility] and its employed or contracted physicians, physician assistants, nurse practitioners or other licensed health care professionals (the Practitioner), to provide me with telemedicine health care services (the "Services") as deemed necessary by the treating Practitioner. I acknowledge and consent to receive the Services by the Practitioner via telemedicine. I understand that the telemedicine visit will involve communicating with the Practitioner through live audiovisual communication technology and the disclosure of certain medical information by electronic transmission. I acknowledge that I have been given the opportunity to request an in-person assessment or other available alternative prior to the telemedicine visit and am voluntarily participating in the telemedicine visit.  I understand that I have the right to withhold or withdraw my consent to the use of telemedicine in the course of my care at any time, without affecting my right to future care or treatment, and that the Practitioner or I may terminate the telemedicine visit at any time. I understand that I have the right to inspect all information obtained and/or recorded in the course of the telemedicine visit and may receive copies of available information for a reasonable fee.  I understand that some of the potential risks of receiving the Services via telemedicine include:  Marland Kitchen Delay or interruption in medical evaluation due to technological equipment failure or disruption; . Information transmitted may not be sufficient (e.g. poor resolution of images) to allow for appropriate medical decision making by the Practitioner;  and/or  . In rare instances, security protocols could fail, causing a breach of personal health information.  Furthermore, I acknowledge that it is my responsibility to provide information about my medical history, conditions and care that is complete and accurate to the best of my ability. I acknowledge that Practitioner's advice, recommendations, and/or decision may be based on factors not within their control, such as incomplete or inaccurate data provided by me or distortions of diagnostic images or specimens that may result from electronic transmissions. I understand that the practice of medicine is not an exact science and that Practitioner makes no warranties or guarantees regarding treatment outcomes. I acknowledge that a copy of this consent can be made available to me via my patient portal ([facility]), or I can request a printed copy by calling the office of [facility].    I understand that my insurance will be billed for this visit.   I have read or had this consent read to me. . I understand the contents of this consent, which adequately explains the benefits and risks of the Services being provided via telemedicine.  . I have been provided ample opportunity to ask questions regarding this consent and the Services and have had my questions answered to my satisfaction. . I give my informed consent for the services to be provided through the use of telemedicine in my medical care

## 2020-10-07 ENCOUNTER — Telehealth: Payer: 59 | Admitting: Cardiovascular Disease

## 2020-10-07 ENCOUNTER — Other Ambulatory Visit: Payer: Self-pay

## 2020-10-19 NOTE — Progress Notes (Unsigned)
Cardiology Office Note  Date:  10/20/2020   ID:  Mary Christian, DOB Jan 18, 1961, MRN [id number]  PCP:  Jerrol Banana., MD   Chief Complaint  Patient presents with  . Follow-up    Annual follow up. Medications verbally reviewed with patient.     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  BP at home 150-160/80 Compliant with amlodipine, losartan No leg swelling Does not take HCTZ Was previously on metoprolol  Denies significant chest pain, shortness of breath on exertion No regular exercise program  Labs reviewed, discussed on today's visit Lab Results  Component Value Date   CHOL 261 (H) 10/08/2019   HDL 73 10/08/2019   LDLCALC 160 (H) 10/08/2019   TRIG 158 (H) 10/08/2019    EKG personally reviewed by myself on todays visit Shows normal sinus rhythm rate 91 bpm no significant ST or T wave changes  Other past medical history Previous  Leg edema has resolved with lower dose amlodipine   previously on Ziac and HCTZ in separate pills.  She was changed to Norvasc   Initially she was on a lower dose, changed to 10 mg. started having leg edema    PMH:   has a past medical history of Abnormal vaginal bleeding, Anxiety, Cervical leiomyoma (02/2016), GERD (gastroesophageal reflux disease), Hiatal hernia, Hypertension, and Renal insufficiency.  PSH:    Past Surgical History:  Procedure Laterality Date  . CESAREAN SECTION    . COLONOSCOPY  08/04/2006   Dr Bary Castilla  . COLONOSCOPY WITH PROPOFOL N/A 06/15/2019   Procedure: COLONOSCOPY WITH Biopsy;  Surgeon: Lucilla Lame, MD;  Location: [place];  Service: Endoscopy;  Laterality: N/A;  . DILATATION &  CURETTAGE/HYSTEROSCOPY WITH MYOSURE N/A 03/05/2016   Procedure: DILATATION & CURETTAGE/HYSTEROSCOPY WITH MYOSURE;  Surgeon: Will Bonnet, MD;  Location: [facility];  Service: Gynecology;  Laterality: N/A;  . DILATION AND CURETTAGE OF UTERUS    . POLYPECTOMY N/A 06/15/2019   Procedure: POLYPECTOMY;  Surgeon: Lucilla Lame, MD;  Location: [place];  Service: Endoscopy;  Laterality: N/A;  Clip x1  placed as site of Ascending Colon Polyp Removal  . TUBAL LIGATION    . UPPER GI ENDOSCOPY  08/04/2006   Dr Bary Castilla    Current Outpatient Medications  Medication Sig Dispense Refill  . CALCIUM CITRATE-VITAMIN D PO Take 630 mg by mouth daily.    Marland Kitchen doxazosin (CARDURA) 2 MG tablet Take 1 tablet (2 mg total) by mouth daily. 30 tablet 6  . omeprazole (PRILOSEC) 20 MG capsule Take 20 mg by mouth daily.    Marland Kitchen ALPRAZolam (XANAX) 0.5 MG tablet Take 1 tablet (0.5 mg total) by mouth 2 (two) times daily as needed for anxiety. 30 tablet 0  . amLODipine (NORVASC) 10 MG tablet Take 1 tablet (10 mg total) by mouth daily. 90 tablet 3  . losartan (COZAAR) 100 MG tablet Take 1 tablet (100 mg total) by mouth daily. 90 tablet 3  . PARoxetine (PAXIL) 20 MG tablet Take 1 tablet (20 mg total) by mouth daily. 90 tablet 3  . rosuvastatin (CRESTOR) 20 MG tablet Take 1 tablet (20 mg total) by mouth daily. Pence  tablet 4   No current facility-administered medications for this visit.     Allergies:   Patient has no known allergies.   Social History:  The patient  reports that she quit smoking about 35 years ago. Her smoking use included cigarettes. She has a 3.00 pack-year smoking history. She has never used smokeless tobacco. She reports current alcohol use of about 14.0 standard drinks of alcohol per week. She reports that she does not use drugs.   Family History:   family history includes Colon cancer in her father and paternal grandfather; Colon cancer (age of onset: 83) in her mother; Heart Problems in her maternal  grandfather; Heart attack (age of onset: 30) in her father; Heart disease in her father; Hyperlipidemia in her sister and sister; Hypertension in her brother, father, mother, sister, and sister; Liver cancer in her father; Ovarian cancer (age of onset: 110) in her paternal aunt; Skin cancer in her sister.    Review of Systems: Review of Systems  Constitutional: Negative.   Respiratory: Negative.   Cardiovascular: Negative.   Gastrointestinal: Negative.   Musculoskeletal: Negative.   Neurological: Negative.   Psychiatric/Behavioral: Negative.   All other systems reviewed and are negative.   PHYSICAL EXAM: VS:  BP (!) 166/84 (BP Location: Left Arm, Patient Position: Sitting, Cuff Size: Normal)   Pulse 91   Ht 5\' 5"  (1.651 m)   Wt 173 lb (78.5 kg)   LMP 06/16/2016   SpO2 96%   BMI 28.79 kg/m  , BMI Body mass index is 28.79 kg/m. Constitutional:  oriented to person, place, and time. No distress.  HENT:  Head: Grossly normal Eyes:  no discharge. No scleral icterus.  Neck: No JVD, no carotid bruits  Cardiovascular: Regular rate and rhythm, no murmurs appreciated Pulmonary/Chest: Clear to auscultation bilaterally, no wheezes or rails Abdominal: Soft.  no distension.  no tenderness.  Musculoskeletal: Normal range of motion Neurological:  normal muscle tone. Coordination normal. No atrophy Skin: Skin warm and dry Psychiatric: normal affect, pleasant   Recent Labs: No results found for requested labs within last 8760 hours.    Lipid Panel Lab Results  Component Value Date   CHOL 261 (H) 10/08/2019   HDL 73 10/08/2019   LDLCALC 160 (H) 10/08/2019   TRIG 158 (H) 10/08/2019      Wt Readings from Last 3 Encounters:  10/20/20 173 lb (78.5 kg)  10/20/20 173 lb (78.5 kg)  02/21/20 174 lb 9.6 oz (79.2 kg)       ASSESSMENT AND PLAN:  Essential hypertension - Plan: EKG 12-Lead Blood pressure running high Discussed various types of medications, will try to continue 1 a day  dosing We will add Cardura 1 mg daily with titration up to 2 mg daily  Anxiety - Plan: EKG 12-Lead Managed by primary care  Hyperlipidemia Recommended management, numbers are markedly elevated Recommended Crestor 5 up to 20 mg daily    Total encounter time more than 25 minutes  Greater than 50% was spent in counseling and coordination of care with the patient    Orders Placed This Encounter  Procedures  . EKG 12-Lead     Signed, Esmond Plants, M.D., Ph.D. 10/20/2020  [place], [place]

## 2020-10-20 ENCOUNTER — Encounter: Payer: Self-pay | Admitting: Obstetrics and Gynecology

## 2020-10-20 ENCOUNTER — Other Ambulatory Visit: Payer: Self-pay

## 2020-10-20 ENCOUNTER — Encounter: Payer: Self-pay | Admitting: Cardiovascular Disease

## 2020-10-20 ENCOUNTER — Ambulatory Visit (INDEPENDENT_AMBULATORY_CARE_PROVIDER_SITE_OTHER): Payer: No Typology Code available for payment source | Admitting: Obstetrics and Gynecology

## 2020-10-20 ENCOUNTER — Ambulatory Visit (INDEPENDENT_AMBULATORY_CARE_PROVIDER_SITE_OTHER): Payer: 59 | Admitting: Cardiovascular Disease

## 2020-10-20 ENCOUNTER — Other Ambulatory Visit: Payer: No Typology Code available for payment source

## 2020-10-20 VITALS — BP 166/84 | HR 91 | Ht 65.0 in | Wt 173.0 lb

## 2020-10-20 VITALS — BP 148/70 | Ht 65.0 in | Wt 173.0 lb

## 2020-10-20 DIAGNOSIS — Z1231 Encounter for screening mammogram for malignant neoplasm of breast: Secondary | ICD-10-CM

## 2020-10-20 DIAGNOSIS — Z01419 Encounter for gynecological examination (general) (routine) without abnormal findings: Secondary | ICD-10-CM

## 2020-10-20 DIAGNOSIS — R1031 Right lower quadrant pain: Secondary | ICD-10-CM

## 2020-10-20 DIAGNOSIS — E782 Mixed hyperlipidemia: Secondary | ICD-10-CM

## 2020-10-20 DIAGNOSIS — R921 Mammographic calcification found on diagnostic imaging of breast: Secondary | ICD-10-CM

## 2020-10-20 DIAGNOSIS — F419 Anxiety disorder, unspecified: Secondary | ICD-10-CM

## 2020-10-20 DIAGNOSIS — N951 Menopausal and female climacteric states: Secondary | ICD-10-CM

## 2020-10-20 DIAGNOSIS — I10 Essential (primary) hypertension: Secondary | ICD-10-CM

## 2020-10-20 MED ORDER — LOSARTAN POTASSIUM 100 MG PO TABS: 100.0000 mg | ORAL_TABLET | Freq: Every day | ORAL | 3 refills | Status: DC

## 2020-10-20 MED ORDER — ROSUVASTATIN CALCIUM 20 MG PO TABS: 20.0000 mg | ORAL_TABLET | Freq: Every day | ORAL | 4 refills | Status: DC

## 2020-10-20 MED ORDER — ALPRAZOLAM 0.5 MG PO TABS
0.5000 mg | ORAL_TABLET | Freq: Two times a day (BID) | ORAL | 0 refills | Status: DC | PRN
Start: 2020-10-20 — End: 2020-12-27

## 2020-10-20 MED ORDER — DOXAZOSIN MESYLATE 2 MG PO TABS: 2.0000 mg | ORAL_TABLET | Freq: Every day | ORAL | 6 refills | Status: DC

## 2020-10-20 MED ORDER — PAROXETINE HCL 20 MG PO TABS
20.0000 mg | ORAL_TABLET | Freq: Every day | ORAL | 3 refills | Status: DC
Start: 2020-10-20 — End: 2021-10-23

## 2020-10-20 MED ORDER — AMLODIPINE BESYLATE 10 MG PO TABS: 10.0000 mg | ORAL_TABLET | Freq: Every day | ORAL | 3 refills | Status: DC

## 2020-10-20 NOTE — Patient Instructions (Addendum)
Medication Instructions:  cardura /doxasozin 2 mg daily Ok to start 1/2 pill (1 mg)  daily for first week or two  Please increase crestor up to 20 mg daily  If you need a refill on your cardiac medications before your next appointment, please call your pharmacy.    Lab work: No new labs needed   If you have labs (blood work) drawn today and your tests are completely normal, you will receive your results only by: Marland Kitchen MyChart Message (if you have MyChart) OR . A paper copy in the mail If you have any lab test that is abnormal or we need to change your treatment, we will call you to review the results.   Testing/Procedures: No new testing needed   Follow-Up: At [facility], you and your health needs are our priority.  As part of our continuing mission to provide you with exceptional heart care, we have created designated Provider Care Teams.  These Care Teams include your primary Cardiologist (physician) and Advanced Practice Providers (APPs -  Physician Assistants and Nurse Practitioners) who all work together to provide you with the care you need, when you need it.  . You will need a follow up appointment in 12 months  . Providers on your designated Care Team:   . Murray Hodgkins, NP . Christell Faith, PA-C . Marrianne Mood, PA-C  Any Other Special Instructions Will Be Listed Below (If Applicable).  COVID-19 Vaccine Information can be found at: ShippingScam.co.uk For questions related to vaccine distribution or appointments, please email vaccine@[email or web address] .[email or web address] or call [phone].

## 2020-10-20 NOTE — Patient Instructions (Addendum)
I value your feedback and you entrusting us with your care. If you get a [facility] patient survey, I would appreciate you taking the time to let us know about your experience today. Thank you!  [facility]: [phone]      

## 2020-10-20 NOTE — Progress Notes (Signed)
PCP: Jerrol Banana., MD   Chief Complaint  Patient presents with  . Gynecologic Exam    Right side pelvic pain    HPI:      [name] is a 60 y.o. No obstetric history on file. who LMP was Patient's last menstrual period was 06/16/2016., presents today for her annual examination.  Her menses are absent due to menopause. Irreg bleeding resolved after endocervical leio removed 2017 with Dr. Glennon Mac. She does not have PMB. She has been having RLQ pains the past month. Sx achy and occur usually with movement such as standing or rolling over in bed. Pt has felt it more the past couple days after holding her grandchild, so thinks it might be MSK. No GI, urin, vag sx.   She does have occas vasomotor sx. Sx tolerable with paxil 20 mg use. She does take xanax sporadically for episodic anxiety. Needs Rx RF.  Sex activity: single partner, contraception - post menopausal status. She does not have vaginal dryness.  Last Pap: 09/25/18 Results were: no abnormalities /neg HPV DNA.  Hx of STDs: none  Last mammogram: 08/01/20  Results were: cat 3 due to RT breast calcifications. Repeat dx mamm due in 12 months.  There is no FH of breast cancer. There is a FH of ovarian cancer in her pat aunt. FH colon cancer in both her parents.  Pt is MyRisk neg 2016. The patient does do self-breast exams.  Colonoscopy: colonoscopy 9/20 with Dr. Allen Norris with pre-cancerous polyps. Repeat due after 5 years.  FH of colon cancer in mother and father.  Tobacco use: The patient denies current or previous tobacco use. Alcohol use: social drinker  No drug use Exercise: moderately active  She does get adequate calcium and Vitamin D in her diet.  Fasting labs with PCP.  Past Medical History:  Diagnosis Date  . Abnormal vaginal bleeding   . Anxiety   . 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 GERD (gastroesophageal reflux disease)   . Hiatal hernia   . Hypertension   . Renal insufficiency     Past  Surgical History:  Procedure Laterality Date  . CESAREAN SECTION    . COLONOSCOPY  08/04/2006   Dr Bary Castilla  . COLONOSCOPY WITH PROPOFOL N/A 06/15/2019   Procedure: COLONOSCOPY WITH Biopsy;  Surgeon: Lucilla Lame, MD;  Location: [place];  Service: Endoscopy;  Laterality: N/A;  . DILATATION & CURETTAGE/HYSTEROSCOPY WITH MYOSURE N/A 03/05/2016   Procedure: DILATATION & CURETTAGE/HYSTEROSCOPY WITH MYOSURE;  Surgeon: Will Bonnet, MD;  Location: [facility];  Service: Gynecology;  Laterality: N/A;  . DILATION AND CURETTAGE OF UTERUS    . POLYPECTOMY N/A 06/15/2019   Procedure: POLYPECTOMY;  Surgeon: Lucilla Lame, MD;  Location: [place];  Service: Endoscopy;  Laterality: N/A;  Clip x1  placed as site of Ascending Colon Polyp Removal  . TUBAL LIGATION    . UPPER GI ENDOSCOPY  08/04/2006   Dr Bary Castilla    Family History  Problem Relation Age of Onset  . Hypertension Father   . Heart disease Father        CABG x 3  . Heart attack Father 89  . Colon cancer Father   . Liver cancer Father   . Hypertension Sister   . Hyperlipidemia Sister   . Skin cancer Sister        14s  . Hypertension Brother   . Hypertension Sister   . Hyperlipidemia Sister   .  Hypertension Mother   . Colon cancer Mother 92       with mets.  . Heart Problems Maternal Grandfather   . Colon cancer Paternal Grandfather   . Ovarian cancer Paternal Aunt 40  . Breast cancer Neg Hx     Social History   Socioeconomic History  . Marital status: Married    Spouse name: Not on file  . Number of children: Not on file  . Years of education: Not on file  . Highest education level: Not on file  Occupational History  . Not on file  Tobacco Use  . Smoking status: Former Smoker    Packs/day: 1.00    Years: 3.00    Pack years: 3.00    Types: Cigarettes    Quit date: 05/01/1985    Years since quitting: 35.4  . Smokeless tobacco: Never Used  Vaping Use  . Vaping Use: Never used  Substance and  Sexual Activity  . Alcohol use: Yes    Alcohol/week: 14.0 standard drinks    Types: 14 Glasses of wine per week  . Drug use: No  . Sexual activity: Yes    Birth control/protection: Post-menopausal  Other Topics Concern  . Not on file  Social History Narrative  . Not on file   Social Determinants of Health   Financial Resource Strain: Not on file  Food Insecurity: Not on file  Transportation Needs: Not on file  Physical Activity: Not on file  Stress: Not on file  Social Connections: Not on file  Intimate Partner Violence: Not on file    Current Meds  Medication Sig  . amLODipine (NORVASC) 10 MG tablet Take 1 tablet (10 mg total) by mouth daily.  Marland Kitchen CALCIUM CITRATE-VITAMIN D PO Take 630 mg by mouth daily.  Marland Kitchen doxazosin (CARDURA) 2 MG tablet Take 1 tablet (2 mg total) by mouth daily.  Marland Kitchen losartan (COZAAR) 100 MG tablet Take 1 tablet (100 mg total) by mouth daily.  Marland Kitchen omeprazole (PRILOSEC) 20 MG capsule Take 20 mg by mouth daily.  . rosuvastatin (CRESTOR) 20 MG tablet Take 1 tablet (20 mg total) by mouth daily.  . [DISCONTINUED] ALPRAZolam (XANAX) 0.5 MG tablet Take 1 tablet (0.5 mg total) by mouth 2 (two) times daily as needed for anxiety.  . [DISCONTINUED] PARoxetine (PAXIL) 20 MG tablet TAKE 1 TABLET BY MOUTH EVERY DAY      ROS:  Review of Systems  Constitutional: Negative for fatigue, fever and unexpected weight change.  Respiratory: Negative for cough, shortness of breath and wheezing.   Cardiovascular: Negative for chest pain, palpitations and leg swelling.  Gastrointestinal: Negative for blood in stool, constipation, diarrhea, nausea and vomiting.  Endocrine: Negative for cold intolerance, heat intolerance and polyuria.  Genitourinary: Positive for pelvic pain. Negative for dyspareunia, dysuria, flank pain, frequency, genital sores, hematuria, menstrual problem, urgency, vaginal bleeding, vaginal discharge and vaginal pain.  Musculoskeletal: Negative for back pain, joint  swelling and myalgias.  Skin: Negative for rash.  Neurological: Negative for dizziness, syncope, light-headedness, numbness and headaches.  Hematological: Negative for adenopathy.  Psychiatric/Behavioral: Negative for agitation, confusion, sleep disturbance and suicidal ideas. The patient is not nervous/anxious.      Objective: BP (!) 148/70   Ht 5\' 5"  (1.651 m)   Wt 173 lb (78.5 kg)   LMP 06/16/2016   BMI 28.79 kg/m    Physical Exam Constitutional:      Appearance: She is well-developed.  Genitourinary:     Vulva normal.  Right Labia: No rash, tenderness or lesions.    Left Labia: No tenderness, lesions or rash.    No vaginal discharge, erythema or tenderness.      Right Adnexa: not tender and no mass present.    Left Adnexa: not tender and no mass present.    No cervical motion tenderness, friability or polyp.     Uterus is not enlarged or tender.  Breasts:     Right: No mass, nipple discharge, skin change or tenderness.     Left: No mass, nipple discharge, skin change or tenderness.    Neck:     Thyroid: No thyromegaly.  Cardiovascular:     Rate and Rhythm: Normal rate and regular rhythm.     Heart sounds: Normal heart sounds. No murmur heard.   Pulmonary:     Effort: Pulmonary effort is normal.     Breath sounds: Normal breath sounds.  Abdominal:     Palpations: Abdomen is soft.     Tenderness: There is no abdominal tenderness. There is no guarding or rebound.  Musculoskeletal:        General: Normal range of motion.     Cervical back: Normal range of motion.  Lymphadenopathy:     Cervical: No cervical adenopathy.  Neurological:     General: No focal deficit present.     Mental Status: She is alert and oriented to person, place, and time.     Cranial Nerves: No cranial nerve deficit.  Skin:    General: Skin is warm and dry.  Psychiatric:        Mood and Affect: Mood normal.        Behavior: Behavior normal.        Thought Content: Thought content  normal.        Judgment: Judgment normal.  Vitals reviewed.     Assessment/Plan:  Encounter for annual routine gynecological examination  Encounter for screening mammogram for malignant neoplasm of breast - Plan: MM DIAG BREAST TOMO BILATERAL, pt to sched dx mammo since 1 yr f/u  Breast calcification seen on mammogram - Plan: MM DIAG BREAST TOMO BILATERAL  Vasomotor symptoms due to menopause - Plan: PARoxetine (PAXIL) 20 MG tablet; Rx RF. Controls sx.   Anxiety - Plan: PARoxetine (PAXIL) 20 MG tablet, ALPRAZolam (XANAX) 0.5 MG tablet; Rx RF, takes sparingly  RLQ abdominal pain--neg exam. Question MSK. Offered Gyn u/s but pt wants to watch for now since not able to do today in office. If sx persist, pt to call me for u/s appt.     Meds ordered this encounter  Medications  . PARoxetine (PAXIL) 20 MG tablet    Sig: Take 1 tablet (20 mg total) by mouth daily.    Dispense:  90 tablet    Refill:  3    Order Specific Question:   Supervising Provider    Answer:   Gae Dry [id number]  . ALPRAZolam (XANAX) 0.5 MG tablet    Sig: Take 1 tablet (0.5 mg total) by mouth 2 (two) times daily as needed for anxiety.    Dispense:  30 tablet    Refill:  0    Not to exceed 5 additional fills before 04/14/2020    Order Specific Question:   Supervising Provider    Answer:   Gae Dry [[id number]]            GYN counsel breast self exam, mammography screening, menopause, adequate intake of calcium and vitamin D, diet and exercise  F/U  Return in about 1 year (around 10/20/2021).  Makina Skow B. Hawley Pavia, PA-C 10/20/2020 4:54 PM

## 2020-10-22 ENCOUNTER — Other Ambulatory Visit: Payer: No Typology Code available for payment source

## 2020-12-13 ENCOUNTER — Other Ambulatory Visit: Payer: Self-pay | Admitting: Obstetrics and Gynecology

## 2020-12-13 DIAGNOSIS — F419 Anxiety disorder, unspecified: Secondary | ICD-10-CM

## 2020-12-27 ENCOUNTER — Other Ambulatory Visit: Payer: Self-pay | Admitting: Obstetrics and Gynecology

## 2020-12-27 DIAGNOSIS — F419 Anxiety disorder, unspecified: Secondary | ICD-10-CM

## 2020-12-29 MED ORDER — ALPRAZOLAM 0.5 MG PO TABS: 0.5000 mg | ORAL_TABLET | Freq: Two times a day (BID) | ORAL | 0 refills | Status: DC | PRN

## 2020-12-29 NOTE — Telephone Encounter (Signed)
Please advise 

## 2021-03-11 ENCOUNTER — Other Ambulatory Visit: Payer: Self-pay | Admitting: Obstetrics and Gynecology

## 2021-03-11 DIAGNOSIS — F419 Anxiety disorder, unspecified: Secondary | ICD-10-CM

## 2021-03-11 MED ORDER — ALPRAZOLAM 0.5 MG PO TABS: 0.5000 mg | ORAL_TABLET | Freq: Two times a day (BID) | ORAL | 0 refills | Status: DC | PRN

## 2021-04-23 ENCOUNTER — Other Ambulatory Visit: Payer: Self-pay | Admitting: Obstetrics and Gynecology

## 2021-04-23 DIAGNOSIS — F419 Anxiety disorder, unspecified: Secondary | ICD-10-CM

## 2021-05-02 ENCOUNTER — Other Ambulatory Visit: Payer: Self-pay | Admitting: Cardiovascular Disease

## 2021-05-11 ENCOUNTER — Other Ambulatory Visit: Payer: Self-pay | Admitting: Obstetrics and Gynecology

## 2021-05-11 ENCOUNTER — Telehealth: Payer: No Typology Code available for payment source

## 2021-05-11 DIAGNOSIS — F419 Anxiety disorder, unspecified: Secondary | ICD-10-CM

## 2021-05-11 MED ORDER — ALPRAZOLAM 0.5 MG PO TABS
0.5000 mg | ORAL_TABLET | Freq: Two times a day (BID) | ORAL | 0 refills | Status: DC | PRN
Start: 2021-05-11 — End: 2021-10-23

## 2021-05-11 NOTE — Telephone Encounter (Signed)
Pt aware.

## 2021-05-11 NOTE — Progress Notes (Signed)
Rx RF xanax to new pharm.

## 2021-05-11 NOTE — Telephone Encounter (Signed)
Mary Christian from [facility] calling; requesting refill of xanax; pt is new to them.  [phone]

## 2021-05-11 NOTE — Telephone Encounter (Signed)
Rx RF eRxd.  

## 2021-05-21 ENCOUNTER — Other Ambulatory Visit: Payer: Self-pay

## 2021-05-21 MED ORDER — DOXAZOSIN MESYLATE 2 MG PO TABS: 2.0000 mg | ORAL_TABLET | Freq: Every day | ORAL | 0 refills | Status: DC

## 2021-05-21 NOTE — Telephone Encounter (Signed)
*  STAT* If patient is at the pharmacy, call can be transferred to refill team.   1. Which medications need to be refilled? (please list name of each medication and dose if known) Doxazosin  2. Which pharmacy/location (including street and city if local pharmacy) is medication to be sent to? [facility]  3. Do they need a 30 day or 90 day supply? Patient is out of town and only needs enough to get her through. Patient needs 54

## 2021-06-08 ENCOUNTER — Other Ambulatory Visit: Payer: Self-pay | Admitting: Cardiovascular Disease

## 2021-07-27 ENCOUNTER — Other Ambulatory Visit: Payer: Self-pay | Admitting: Obstetrics and Gynecology

## 2021-07-27 DIAGNOSIS — N951 Menopausal and female climacteric states: Secondary | ICD-10-CM

## 2021-07-27 DIAGNOSIS — F419 Anxiety disorder, unspecified: Secondary | ICD-10-CM

## 2021-07-30 ENCOUNTER — Other Ambulatory Visit: Payer: Self-pay | Admitting: Obstetrics and Gynecology

## 2021-07-30 DIAGNOSIS — N951 Menopausal and female climacteric states: Secondary | ICD-10-CM

## 2021-07-30 DIAGNOSIS — F419 Anxiety disorder, unspecified: Secondary | ICD-10-CM

## 2021-09-07 ENCOUNTER — Other Ambulatory Visit: Payer: Self-pay | Admitting: Cardiovascular Disease

## 2021-10-13 ENCOUNTER — Other Ambulatory Visit: Payer: Self-pay | Admitting: Cardiovascular Disease

## 2021-10-23 ENCOUNTER — Telehealth: Payer: Self-pay | Admitting: Obstetrics and Gynecology

## 2021-10-23 ENCOUNTER — Other Ambulatory Visit: Payer: Self-pay | Admitting: Obstetrics and Gynecology

## 2021-10-23 ENCOUNTER — Other Ambulatory Visit: Payer: Self-pay | Admitting: Cardiovascular Disease

## 2021-10-23 DIAGNOSIS — F419 Anxiety disorder, unspecified: Secondary | ICD-10-CM

## 2021-10-23 DIAGNOSIS — R921 Mammographic calcification found on diagnostic imaging of breast: Secondary | ICD-10-CM

## 2021-10-23 DIAGNOSIS — N951 Menopausal and female climacteric states: Secondary | ICD-10-CM

## 2021-10-23 DIAGNOSIS — Z1231 Encounter for screening mammogram for malignant neoplasm of breast: Secondary | ICD-10-CM

## 2021-10-23 MED ORDER — PAROXETINE HCL 20 MG PO TABS: 20.0000 mg | ORAL_TABLET | Freq: Every day | ORAL | 0 refills | Status: DC

## 2021-10-23 NOTE — Telephone Encounter (Signed)
Patient aware.

## 2021-10-23 NOTE — Telephone Encounter (Signed)
Pt is scheduled for her Annual on 3/2 with you.  She is wanting to get a refill on her Paroxetine (20mg ), since she is not being seen until 3/2.

## 2021-10-23 NOTE — Progress Notes (Signed)
Rx RF paxil till 3/23 annual

## 2021-10-23 NOTE — Telephone Encounter (Signed)
Please contact pt for future appointment. Pt due for 12 month f/u. Pt requesting refills.

## 2021-10-23 NOTE — Telephone Encounter (Signed)
Rx RF eRxd.  

## 2021-10-24 MED ORDER — ALPRAZOLAM 0.5 MG PO TABS: 0.5000 mg | ORAL_TABLET | Freq: Two times a day (BID) | ORAL | 0 refills | Status: DC | PRN

## 2021-10-27 ENCOUNTER — Other Ambulatory Visit: Payer: Self-pay | Admitting: Cardiovascular Disease

## 2021-10-27 MED ORDER — DOXAZOSIN MESYLATE 2 MG PO TABS: 2.0000 mg | ORAL_TABLET | Freq: Every day | ORAL | 0 refills | Status: DC

## 2021-11-11 ENCOUNTER — Other Ambulatory Visit: Payer: Self-pay | Admitting: Cardiovascular Disease

## 2021-11-19 ENCOUNTER — Ambulatory Visit (INDEPENDENT_AMBULATORY_CARE_PROVIDER_SITE_OTHER): Payer: No Typology Code available for payment source | Admitting: Obstetrics and Gynecology

## 2021-11-19 ENCOUNTER — Encounter: Payer: Self-pay | Admitting: Obstetrics and Gynecology

## 2021-11-19 ENCOUNTER — Other Ambulatory Visit: Payer: Self-pay

## 2021-11-19 VITALS — BP 100/64 | Ht 65.0 in | Wt 172.0 lb

## 2021-11-19 DIAGNOSIS — F419 Anxiety disorder, unspecified: Secondary | ICD-10-CM

## 2021-11-19 DIAGNOSIS — N951 Menopausal and female climacteric states: Secondary | ICD-10-CM

## 2021-11-19 DIAGNOSIS — R921 Mammographic calcification found on diagnostic imaging of breast: Secondary | ICD-10-CM

## 2021-11-19 DIAGNOSIS — Z1231 Encounter for screening mammogram for malignant neoplasm of breast: Secondary | ICD-10-CM

## 2021-11-19 DIAGNOSIS — Z01419 Encounter for gynecological examination (general) (routine) without abnormal findings: Secondary | ICD-10-CM

## 2021-11-19 MED ORDER — ALPRAZOLAM 0.5 MG PO TABS: 0.5000 mg | ORAL_TABLET | Freq: Two times a day (BID) | ORAL | 0 refills | Status: DC | PRN

## 2021-11-19 MED ORDER — PAROXETINE HCL 20 MG PO TABS: 20.0000 mg | ORAL_TABLET | Freq: Every day | ORAL | 3 refills | Status: DC

## 2021-11-19 NOTE — Progress Notes (Signed)
PCP: Jerrol Banana., MD   Chief Complaint  Patient presents with   Gynecologic Exam    No concerns    HPI:      [name] is a 61 y.o. No obstetric history on file. who LMP was Patient's last menstrual period was 06/16/2016., presents today for her annual examination.  Her menses are absent due to menopause. Irreg bleeding resolved after endocervical leio removed 2017 with Dr. Glennon Mac. She does not have PMB. No pelvic pain.   She does have occas vasomotor sx. Sx tolerable with paxil 20 mg use. She does take xanax sporadically for episodic anxiety. Needs Rx RFs.  Sex activity: single partner, contraception - post menopausal status. She does not have vaginal dryness.  Last Pap: 09/25/18 Results were: no abnormalities /neg HPV DNA.  Hx of STDs: none  Last mammogram: 08/01/20  Results were: cat 3 due to RT breast calcifications. Repeat dx mamm due in 12 months.  There is no FH of breast cancer. There is a FH of ovarian cancer in her pat aunt. FH colon cancer in both her parents.  Pt is MyRisk neg 2016. The patient does do self-breast exams.  Colonoscopy: colonoscopy 9/20 with Dr. Allen Norris with pre-cancerous polyps. Repeat due after 5 years. FH of colon cancer in mother and father.  Tobacco use: The patient denies current or previous tobacco use. Alcohol use: social drinker  No drug use Exercise: moderately active  She does get adequate calcium and Vitamin D in her diet.  Fasting labs with PCP.  Past Medical History:  Diagnosis Date   Abnormal vaginal bleeding    Anxiety    Cervical leiomyoma 02/2016   removed surg   GERD (gastroesophageal reflux disease)    Hiatal hernia    Hypertension    Renal insufficiency     Past Surgical History:  Procedure Laterality Date   CESAREAN SECTION     COLONOSCOPY  08/04/2006   Dr Bary Castilla   COLONOSCOPY WITH PROPOFOL N/A 06/15/2019   Procedure: COLONOSCOPY WITH Biopsy;  Surgeon: Lucilla Lame, MD;  Location: [facility];  Service: Endoscopy;  Laterality: N/A;   DILATATION & CURETTAGE/HYSTEROSCOPY WITH MYOSURE N/A 03/05/2016   Procedure: DILATATION & CURETTAGE/HYSTEROSCOPY WITH MYOSURE;  Surgeon: Will Bonnet, MD;  Location: [facility];  Service: Gynecology;  Laterality: N/A;   DILATION AND CURETTAGE OF UTERUS     POLYPECTOMY N/A 06/15/2019   Procedure: POLYPECTOMY;  Surgeon: Lucilla Lame, MD;  Location: [facility] [place];  Service: Endoscopy;  Laterality: N/A;  Clip x1  placed as site of Ascending Colon Polyp Removal   TUBAL LIGATION     UPPER GI ENDOSCOPY  08/04/2006   Dr Bary Castilla    Family History  Problem Relation Age of Onset   Hypertension Father    Heart disease Father        CABG x 3   Heart attack Father 67   Colon cancer Father    Liver cancer Father    Hypertension Sister    Hyperlipidemia Sister    Skin cancer Sister        47s   Hypertension Brother    Hypertension Sister    Hyperlipidemia Sister    Hypertension Mother    Colon cancer Mother 54       with mets.   Heart Problems Maternal Grandfather    Colon cancer Paternal Grandfather    Ovarian cancer Paternal Aunt 72   Breast cancer Neg Hx  Social History   Socioeconomic History   Marital status: Married    Spouse name: Not on file   Number of children: Not on file   Years of education: Not on file   Highest education level: Not on file  Occupational History   Not on file  Tobacco Use   Smoking status: Former    Packs/day: 1.00    Years: 3.00    Pack years: 3.00    Types: Cigarettes    Quit date: 05/01/1985    Years since quitting: 36.5   Smokeless tobacco: Never  Vaping Use   Vaping Use: Never used  Substance and Sexual Activity   Alcohol use: Yes    Alcohol/week: 14.0 standard drinks    Types: 14 Glasses of wine per week   Drug use: No   Sexual activity: Yes    Birth control/protection: Post-menopausal  Other Topics Concern   Not on file  Social History Narrative   Not on file   Social  Determinants of Health   Financial Resource Strain: Not on file  Food Insecurity: Not on file  Transportation Needs: Not on file  Physical Activity: Not on file  Stress: Not on file  Social Connections: Not on file  Intimate Partner Violence: Not on file    Current Meds  Medication Sig   amLODipine (NORVASC) 10 MG tablet TAKE ONE TABLET BY MOUTH EVERY DAY   CALCIUM CITRATE-VITAMIN D PO Take 630 mg by mouth daily.   doxazosin (CARDURA) 2 MG tablet TAKE ONE TABLET BY MOUTH EVERY DAY   losartan (COZAAR) 100 MG tablet TAKE ONE TABLET EVERY DAY   omeprazole (PRILOSEC) 20 MG capsule Take 20 mg by mouth daily.   rosuvastatin (CRESTOR) 20 MG tablet TAKE ONE TABLET BY MOUTH EVERY DAY   [DISCONTINUED] ALPRAZolam (XANAX) 0.5 MG tablet Take 1 tablet (0.5 mg total) by mouth 2 (two) times daily as needed for anxiety.   [DISCONTINUED] PARoxetine (PAXIL) 20 MG tablet Take 1 tablet (20 mg total) by mouth daily.      ROS:  Review of Systems  Constitutional:  Negative for fatigue, fever and unexpected weight change.  Respiratory:  Negative for cough, shortness of breath and wheezing.   Cardiovascular:  Negative for chest pain, palpitations and leg swelling.  Gastrointestinal:  Negative for blood in stool, constipation, diarrhea, nausea and vomiting.  Endocrine: Negative for cold intolerance, heat intolerance and polyuria.  Genitourinary:  Negative for dyspareunia, dysuria, flank pain, frequency, genital sores, hematuria, menstrual problem, pelvic pain, urgency, vaginal bleeding, vaginal discharge and vaginal pain.  Musculoskeletal:  Negative for back pain, joint swelling and myalgias.  Skin:  Negative for rash.  Neurological:  Negative for dizziness, syncope, light-headedness, numbness and headaches.  Hematological:  Negative for adenopathy.  Psychiatric/Behavioral:  Negative for agitation, confusion, sleep disturbance and suicidal ideas. The patient is not nervous/anxious.     Objective: BP  100/64    Ht 5\' 5"  (1.651 m)    Wt 172 lb (78 kg)    LMP 06/16/2016    BMI 28.62 kg/m    Physical Exam Constitutional:      Appearance: She is well-developed.  Genitourinary:     Vulva normal.     Right Labia: No rash, tenderness or lesions.    Left Labia: No tenderness, lesions or rash.    No vaginal discharge, erythema or tenderness.      Right Adnexa: not tender and no mass present.    Left Adnexa: not tender and no  mass present.    No cervical motion tenderness, friability or polyp.     Uterus is not enlarged or tender.  Breasts:    Right: No mass, nipple discharge, skin change or tenderness.     Left: No mass, nipple discharge, skin change or tenderness.  Neck:     Thyroid: No thyromegaly.  Cardiovascular:     Rate and Rhythm: Normal rate and regular rhythm.     Heart sounds: Normal heart sounds. No murmur heard. Pulmonary:     Effort: Pulmonary effort is normal.     Breath sounds: Normal breath sounds.  Abdominal:     Palpations: Abdomen is soft.     Tenderness: There is no abdominal tenderness. There is no guarding or rebound.  Musculoskeletal:        General: Normal range of motion.     Cervical back: Normal range of motion.  Lymphadenopathy:     Cervical: No cervical adenopathy.  Neurological:     General: No focal deficit present.     Mental Status: She is alert and oriented to person, place, and time.     Cranial Nerves: No cranial nerve deficit.  Skin:    General: Skin is warm and dry.  Psychiatric:        Mood and Affect: Mood normal.        Behavior: Behavior normal.        Thought Content: Thought content normal.        Judgment: Judgment normal.  Vitals reviewed.    Assessment/Plan:  Encounter for annual routine gynecological examination  Encounter for screening mammogram for malignant neoplasm of breast - Plan: MM DIAG BREAST TOMO BILATERAL; pt to schedule mammo  Breast calcification seen on mammogram - Plan: MM DIAG BREAST TOMO  BILATERAL  Vasomotor symptoms due to menopause - Plan: PARoxetine (PAXIL) 20 MG tablet; Rx RF. Doing well.   Anxiety - Plan: ALPRAZolam (XANAX) 0.5 MG tablet, PARoxetine (PAXIL) 20 MG tablet; Rx RF. Takes sparingly.    Meds ordered this encounter  Medications   ALPRAZolam (XANAX) 0.5 MG tablet    Sig: Take 1 tablet (0.5 mg total) by mouth 2 (two) times daily as needed for anxiety.    Dispense:  30 tablet    Refill:  0    Not to exceed 5 additional fills before 04/14/2020    Order Specific Question:   Supervising Provider    Answer:   Gae Dry [[id number]]   PARoxetine (PAXIL) 20 MG tablet    Sig: Take 1 tablet (20 mg total) by mouth daily.    Dispense:  90 tablet    Refill:  3    Order Specific Question:   Supervising Provider    Answer:   Gae Dry [[id number]]            GYN counsel breast self exam, mammography screening, menopause, adequate intake of calcium and vitamin D, diet and exercise    F/U  Return in about 1 year (around 11/20/2022).  Tyronica Truxillo B. Praneeth Bussey, PA-C 11/19/2021 1:52 PM

## 2021-11-19 NOTE — Patient Instructions (Addendum)
I value your feedback and you entrusting us with your care. If you get a [facility] patient survey, I would appreciate you taking the time to let us know about your experience today. Thank you!  [facility]: [phone]      

## 2021-11-24 ENCOUNTER — Encounter: Payer: Self-pay | Admitting: Cardiovascular Disease

## 2021-11-24 ENCOUNTER — Ambulatory Visit (INDEPENDENT_AMBULATORY_CARE_PROVIDER_SITE_OTHER): Payer: No Typology Code available for payment source | Admitting: Cardiovascular Disease

## 2021-11-24 ENCOUNTER — Other Ambulatory Visit: Payer: Self-pay

## 2021-11-24 VITALS — BP 130/72 | HR 88 | Ht 65.0 in | Wt 173.0 lb

## 2021-11-24 DIAGNOSIS — M7989 Other specified soft tissue disorders: Secondary | ICD-10-CM | POA: Diagnosis not present

## 2021-11-24 DIAGNOSIS — E7800 Pure hypercholesterolemia, unspecified: Secondary | ICD-10-CM | POA: Diagnosis not present

## 2021-11-24 DIAGNOSIS — Z8342 Family history of familial hypercholesterolemia: Secondary | ICD-10-CM | POA: Diagnosis not present

## 2021-11-24 DIAGNOSIS — I10 Essential (primary) hypertension: Secondary | ICD-10-CM

## 2021-11-24 NOTE — Progress Notes (Signed)
Cardiology Office Note ? ?Date:  11/24/2021  ? ?ID:  Mary Christian, DOB 1961/06/07, MRN [id number] ? ?PCP:  Jerrol Banana., MD  ? ?Chief Complaint  ?Patient presents with  ? 12 month follow up   ?  "Doing well." Medications reviewed by the patient verbally.   ? ? ?HPI:  ?Mary Christian is a 61 year-old woman with a history of  ?hypertension,  ?anxiety,  ?previous episode of acute renal failure from dehydration in 2013, ?urinary tract infection in June 2013,  ?Smoked for 2 years ?who presents for  Follow-up of her hypertension ? ?Last seen in clinic 09/2018 ? ?Continues to work for [employer] ?Able to do some remote work, going to [employer] ? ?Active ?Reports blood pressure well controlled on current medication regiment ?Denies chest pain or shortness of breath concerning for angina ? ? ?Lab Results  ?Component Value Date  ? CHOL 261 (H) 10/08/2019  ? HDL 73 10/08/2019  ? Ada 160 (H) 10/08/2019  ? TRIG 158 (H) 10/08/2019  ? ? ?No recent lipid panel available ? ?EKG personally reviewed by myself on todays visit ?Shows normal sinus rhythm rate 88 bpm no significant ST or T wave changes ?  ?Other past medical history ?Previous  Leg edema has resolved with lower dose amlodipine ?  ? previously on Ziac and HCTZ in separate pills.  ?She was changed to Norvasc   Initially she was on a lower dose, changed to 10 mg. started having leg edema  ? ?Dad with CAD, CABG, age 74 ?  ? ?PMH:   has a past medical history of Abnormal vaginal bleeding, Anxiety, Cervical leiomyoma (02/2016), GERD (gastroesophageal reflux disease), Hiatal hernia, Hypertension, and Renal insufficiency. ? ?PSH:    ?Past Surgical History:  ?Procedure Laterality Date  ? CESAREAN SECTION    ? COLONOSCOPY  08/04/2006  ? Dr Bary Castilla  ? COLONOSCOPY WITH PROPOFOL N/A 06/15/2019  ? Procedure: COLONOSCOPY WITH Biopsy;  Surgeon: Lucilla Lame, MD;  Location: [facility];  Service: Endoscopy;  Laterality: N/A;  ? DILATATION & CURETTAGE/HYSTEROSCOPY  WITH MYOSURE N/A 03/05/2016  ? Procedure: DILATATION & CURETTAGE/HYSTEROSCOPY WITH MYOSURE;  Surgeon: Will Bonnet, MD;  Location: [facility];  Service: Gynecology;  Laterality: N/A;  ? DILATION AND CURETTAGE OF UTERUS    ? POLYPECTOMY N/A 06/15/2019  ? Procedure: POLYPECTOMY;  Surgeon: Lucilla Lame, MD;  Location: [place];  Service: Endoscopy;  Laterality: N/A;  Clip x1  placed as site of Ascending Colon Polyp Removal  ? TUBAL LIGATION    ? UPPER GI ENDOSCOPY  08/04/2006  ? Dr Bary Castilla  ? ? ?Current Outpatient Medications  ?Medication Sig Dispense Refill  ? ALPRAZolam (XANAX) 0.5 MG tablet Take 1 tablet (0.5 mg total) by mouth 2 (two) times daily as needed for anxiety. 30 tablet 0  ? amLODipine (NORVASC) 10 MG tablet TAKE ONE TABLET BY MOUTH EVERY DAY 90 tablet 0  ? CALCIUM CITRATE-VITAMIN D PO Take 630 mg by mouth daily.    ? doxazosin (CARDURA) 2 MG tablet TAKE ONE TABLET BY MOUTH EVERY DAY 30 tablet 3  ? losartan (COZAAR) 100 MG tablet TAKE ONE TABLET EVERY DAY 90 tablet 0  ? omeprazole (PRILOSEC) 20 MG capsule Take 20 mg by mouth daily.    ? PARoxetine (PAXIL) 20 MG tablet Take 1 tablet (20 mg total) by mouth daily. 90 tablet 3  ? rosuvastatin (CRESTOR) 20 MG tablet TAKE ONE TABLET BY MOUTH EVERY DAY 90 tablet  4  ? ?No current facility-administered medications for this visit.  ? ? ?Allergies:   Patient has no known allergies.  ? ?Social History:  The patient  reports that she quit smoking about 36 years ago. Her smoking use included cigarettes. She has a 3.00 pack-year smoking history. She has never used smokeless tobacco. She reports current alcohol use of about 14.0 standard drinks per week. She reports that she does not use drugs.  ? ?Family History:   family history includes Colon cancer in her father and paternal grandfather; Colon cancer (age of onset: 17) in her mother; Heart Problems in her maternal grandfather; Heart attack (age of onset: 68) in her father; Heart disease in her father;  Hyperlipidemia in her sister and sister; Hypertension in her brother, father, mother, sister, and sister; Liver cancer in her father; Ovarian cancer (age of onset: 37) in her paternal aunt; Skin cancer in her sister.  ? ?Review of Systems: ?Review of Systems  ?Constitutional: Negative.   ?HENT: Negative.    ?Respiratory: Negative.    ?Cardiovascular: Negative.   ?Gastrointestinal: Negative.   ?Musculoskeletal: Negative.   ?Neurological: Negative.   ?Psychiatric/Behavioral: Negative.    ?All other systems reviewed and are negative. ? ?PHYSICAL EXAM: ?VS:  BP 130/72 (BP Location: Left Arm, Patient Position: Sitting, Cuff Size: Normal)   Pulse 88   Ht '5\' 5"'$  (1.651 m)   Wt 173 lb (78.5 kg)   LMP 06/16/2016   SpO2 96%   BMI 28.79 kg/m?  , BMI Body mass index is 28.79 kg/m?Marland Kitchen ?Constitutional:  oriented to person, place, and time. No distress.  ?HENT:  ?Head: Grossly normal ?Eyes:  no discharge. No scleral icterus.  ?Neck: No JVD, no carotid bruits  ?Cardiovascular: Regular rate and rhythm, no murmurs appreciated ?Pulmonary/Chest: Clear to auscultation bilaterally, no wheezes or rails ?Abdominal: Soft.  no distension.  no tenderness.  ?Musculoskeletal: Normal range of motion ?Neurological:  normal muscle tone. Coordination normal. No atrophy ?Skin: Skin warm and dry ?Psychiatric: normal affect, pleasant ? ?Recent Labs: ?No results found for requested labs within last 8760 hours.  ? ? ?Lipid Panel ?Lab Results  ?Component Value Date  ? CHOL 261 (H) 10/08/2019  ? HDL 73 10/08/2019  ? Nathalie 160 (H) 10/08/2019  ? TRIG 158 (H) 10/08/2019  ? ?  ? ?Wt Readings from Last 3 Encounters:  ?11/24/21 173 lb (78.5 kg)  ?11/19/21 172 lb (78 kg)  ?10/20/20 173 lb (78.5 kg)  ?  ? ?ASSESSMENT AND PLAN: ? ?Essential hypertension  ?Blood pressure is well controlled on today's visit. No changes made to the medications. ? ?Anxiety  ?Managed by primary care ?Stable ? ?Hyperlipidemia ?On Crestor 20, repeat lipid panel and LFTs has been  ordered ? ?Discussed screening studies given strong family history ?We have ordered CT coronary calcium scoring for further risk stratification ? ? Total encounter time more than 25 minutes ? Greater than 50% was spent in counseling and coordination of care with the patient ? ? ? ?No orders of the defined types were placed in this encounter. ? ? ? ?Signed, ?Esmond Plants, M.D., Ph.D. ?11/24/2021  ?[place] ?[phone] ? ?

## 2021-11-24 NOTE — Patient Instructions (Addendum)
Medication Instructions:  ?No changes ? ?If you need a refill on your cardiac medications before your next appointment, please call your pharmacy.  ? ?Lab work: ?Lipids and LFTs ? ?Testing/Procedures: ?We have order a CT coronary calcium score (you may see results on your MyChart account, but we will call via phone with the results) ? ?This is a $99 out of pocket expense  ? ?This procedure uses special x-ray equipment to produce pictures of the coronary arteries to determine if they are blocked or narrowed by the buildup of plaque - an indicator for atherosclerosis or coronary artery disease (CAD). ? ?Please call [phone] to schedule at your earliest convince  ?This is done at our [facility] in [place] ?[address] ?[place], [place] [address] ? ? ?Follow-Up: ?At [facility], you and your health needs are our priority.  As part of our continuing mission to provide you with exceptional heart care, we have created designated Provider Care Teams.  These Care Teams include your primary Cardiologist (physician) and Advanced Practice Providers (APPs -  Physician Assistants and Nurse Practitioners) who all work together to provide you with the care you need, when you need it. ? ?You will need a follow up appointment in 12 months ? ?Providers on your designated Care Team:   ?Murray Hodgkins, NP ?Christell Faith, PA-C ?Cadence Kathlen Mody, PA-C ? ?COVID-19 Vaccine Information can be found at: ShippingScam.co.[email or web address] For questions related to vaccine distribution or appointments, please email [email or web address] or call [phone].  ? ?

## 2021-11-25 LAB — LIPID PANEL
Chol/HDL Ratio: 2.6 ratio (ref 0.0–4.4)
Cholesterol, Total: 162 mg/dL (ref 100–199)
HDL: 63 mg/dL (ref >39)
LDL Chol Calc (NIH): 76 mg/dL (ref 0–99)
Triglycerides: 133 mg/dL (ref 0–149)
VLDL Cholesterol Cal: 23 mg/dL (ref 5–40)

## 2021-11-25 LAB — HEPATIC FUNCTION PANEL
ALT: 35 IU/L — ABNORMAL HIGH (ref 0–32)
AST: 26 IU/L (ref 0–40)
Albumin: 4.7 g/dL (ref 3.8–4.9)
Alkaline Phosphatase: 101 IU/L (ref 44–121)
Bilirubin Total: <0.2 mg/dL (ref 0.0–1.2)
Bilirubin, Direct: <0.1 mg/dL (ref 0.00–0.40)
Total Protein: 7.6 g/dL (ref 6.0–8.5)

## 2021-12-01 ENCOUNTER — Other Ambulatory Visit: Payer: No Typology Code available for payment source

## 2021-12-08 ENCOUNTER — Inpatient Hospital Stay: Admission: RE | Admit: 2021-12-08 | Payer: No Typology Code available for payment source | Source: Ambulatory Visit

## 2021-12-08 ENCOUNTER — Other Ambulatory Visit: Payer: Self-pay | Admitting: Cardiovascular Disease

## 2021-12-09 ENCOUNTER — Ambulatory Visit
Admission: RE | Admit: 2021-12-09 | Discharge: 2021-12-09 | Disposition: A | Discharge status: Home / Self Care | Payer: No Typology Code available for payment source | Source: Ambulatory Visit | Attending: Obstetrics and Gynecology | Admitting: Obstetrics and Gynecology

## 2021-12-09 ENCOUNTER — Other Ambulatory Visit: Payer: Self-pay

## 2021-12-09 DIAGNOSIS — R921 Mammographic calcification found on diagnostic imaging of breast: Secondary | ICD-10-CM | POA: Insufficient documentation

## 2021-12-09 DIAGNOSIS — Z1231 Encounter for screening mammogram for malignant neoplasm of breast: Secondary | ICD-10-CM | POA: Diagnosis present

## 2021-12-14 ENCOUNTER — Other Ambulatory Visit: Payer: Self-pay | Admitting: Obstetrics and Gynecology

## 2021-12-14 DIAGNOSIS — F419 Anxiety disorder, unspecified: Secondary | ICD-10-CM

## 2021-12-14 NOTE — Telephone Encounter (Signed)
Pls check with pharm to see that she has the RF from 11/19/21

## 2022-01-14 ENCOUNTER — Other Ambulatory Visit: Payer: Self-pay | Admitting: Cardiovascular Disease

## 2022-01-20 ENCOUNTER — Ambulatory Visit
Admission: RE | Admit: 2022-01-20 | Discharge: 2022-01-20 | Disposition: A | Discharge status: Home / Self Care | Payer: No Typology Code available for payment source | Source: Ambulatory Visit | Attending: Cardiovascular Disease | Admitting: Cardiovascular Disease

## 2022-01-20 DIAGNOSIS — E7800 Pure hypercholesterolemia, unspecified: Secondary | ICD-10-CM | POA: Insufficient documentation

## 2022-01-20 DIAGNOSIS — Z8342 Family history of familial hypercholesterolemia: Secondary | ICD-10-CM | POA: Insufficient documentation

## 2022-01-22 ENCOUNTER — Other Ambulatory Visit: Payer: Self-pay | Admitting: Obstetrics and Gynecology

## 2022-01-22 ENCOUNTER — Telehealth: Payer: Self-pay | Admitting: Emergency Medicine

## 2022-01-22 ENCOUNTER — Encounter: Payer: Self-pay | Admitting: Cardiovascular Disease

## 2022-01-22 DIAGNOSIS — F419 Anxiety disorder, unspecified: Secondary | ICD-10-CM

## 2022-01-22 MED ORDER — EZETIMIBE 10 MG PO TABS: 10.0000 mg | ORAL_TABLET | Freq: Every day | ORAL | 3 refills | Status: DC

## 2022-01-22 MED ORDER — ALPRAZOLAM 0.5 MG PO TABS: 0.5000 mg | ORAL_TABLET | Freq: Two times a day (BID) | ORAL | 0 refills | Status: DC | PRN

## 2022-01-22 NOTE — Telephone Encounter (Signed)
Minna Merritts, MD  Mila Merry, RN ?CT coronary calcium scoring  ?We are seeing coronary calcification  ?Likely secondary history of high cholesterol  ?No further testing needed just need to continue very low cholesterol levels  ?Numbers are much better on the Crestor down 100 points  ?Ideally need LDL little bit lower  ?Would consider adding Zetia 10 mg daily to the Crestor  ? ? ?Patient sent MyChart message requesting new RX be sent in to [facility] today, as she goes to [facility] on Monday. Called patient to ensure that she didn't have any questions regarding results. Pt stated that she didn't. Advised patient that I would call zetia into preferred pharmacy. Pt verbalized understanding and voiced appreciation for the call.  ? ? ?

## 2022-03-31 ENCOUNTER — Encounter: Payer: Self-pay | Admitting: Family Medicine

## 2022-03-31 ENCOUNTER — Ambulatory Visit (INDEPENDENT_AMBULATORY_CARE_PROVIDER_SITE_OTHER): Payer: No Typology Code available for payment source | Admitting: Family Medicine

## 2022-03-31 VITALS — BP 138/85 | HR 84 | Resp 16 | Wt 173.0 lb

## 2022-03-31 DIAGNOSIS — M25562 Pain in left knee: Secondary | ICD-10-CM | POA: Diagnosis not present

## 2022-03-31 MED ORDER — NAPROXEN 500 MG PO TABS: 500.0000 mg | ORAL_TABLET | Freq: Two times a day (BID) | ORAL | 0 refills | Status: DC

## 2022-03-31 NOTE — Progress Notes (Unsigned)
Established patient visit  I,Mary Christian,acting as a scribe for Wilhemena Durie, MD.,have documented all relevant documentation on the behalf of Wilhemena Durie, MD,as directed by  Wilhemena Durie, MD while in the presence of Wilhemena Durie, MD.   Patient: Mary Christian   DOB: [birth year]   61 y.o. Female  MRN: [id number] Visit Date: 03/31/2022  Today's healthcare provider: Wilhemena Durie, MD   Chief Complaint  Patient presents with   Knee Pain   Subjective    HPI  Patient has had left knee pain for 1 month. Patient states she played golf and the next day her left knee was hurting. Patient also has symptoms of swelling and radiation down left lower leg to foot. Patient has been treating pain with ice, heat, rest, and ibuprofen with no relief.  No known trauma.  No specific injury.  She has not injured her knee in the past. Medications: Outpatient Medications Prior to Visit  Medication Sig   ALPRAZolam (XANAX) 0.5 MG tablet Take 1 tablet (0.5 mg total) by mouth 2 (two) times daily as needed for anxiety.   amLODipine (NORVASC) 10 MG tablet TAKE ONE TABLET BY MOUTH EVERY DAY   CALCIUM CITRATE-VITAMIN D PO Take 630 mg by mouth daily.   doxazosin (CARDURA) 2 MG tablet TAKE ONE TABLET BY MOUTH EVERY DAY   ezetimibe (ZETIA) 10 MG tablet Take 1 tablet (10 mg total) by mouth daily.   losartan (COZAAR) 100 MG tablet TAKE ONE TABLET EVERY DAY   omeprazole (PRILOSEC) 20 MG capsule Take 20 mg by mouth daily.   PARoxetine (PAXIL) 20 MG tablet Take 1 tablet (20 mg total) by mouth daily.   rosuvastatin (CRESTOR) 20 MG tablet TAKE ONE TABLET BY MOUTH EVERY DAY   No facility-administered medications prior to visit.    Review of Systems  Constitutional:  Negative for appetite change, chills, fatigue and fever.  Respiratory:  Negative for chest tightness and shortness of breath.   Cardiovascular:  Negative for chest pain and palpitations.  Gastrointestinal:  Negative for  abdominal pain, nausea and vomiting.  Musculoskeletal:  Positive for arthralgias.  Neurological:  Negative for dizziness and weakness.    Last metabolic panel Lab Results  Component Value Date   GLUCOSE 95 10/08/2019   NA 140 10/08/2019   K 4.8 10/08/2019   CL 103 10/08/2019   CO2 20 10/08/2019   BUN 14 10/08/2019   CREATININE 0.97 10/08/2019   GFRNONAA 65 10/08/2019   CALCIUM 11.4 (H) 10/08/2019   PROT 7.6 11/24/2021   ALBUMIN 4.7 11/24/2021   LABGLOB 3.1 10/08/2019   AGRATIO 1.5 10/08/2019   BILITOT <0.2 11/24/2021   ALKPHOS 101 11/24/2021   AST 26 11/24/2021   ALT 35 (H) 11/24/2021   ANIONGAP 10 03/01/2016       Objective    BP 138/85 (BP Location: Right Arm, Patient Position: Sitting, Cuff Size: Normal)   Pulse 84   Resp 16   Wt 173 lb (78.5 kg)   LMP 06/16/2016   SpO2 96%   BMI 28.79 kg/m  BP Readings from Last 3 Encounters:  03/31/22 138/85  11/24/21 130/72  11/19/21 100/64   Wt Readings from Last 3 Encounters:  03/31/22 173 lb (78.5 kg)  11/24/21 173 lb (78.5 kg)  11/19/21 172 lb (78 kg)      Physical Exam Constitutional:      General: She is not in acute distress.    Appearance: She is  well-developed.  HENT:     Head: Normocephalic and atraumatic.     Right Ear: Hearing normal.     Left Ear: Hearing normal.     Nose: Nose normal.  Eyes:     General: Lids are normal. No scleral icterus.       Right eye: No discharge.        Left eye: No discharge.     Conjunctiva/sclera: Conjunctivae normal.  Pulmonary:     Effort: Pulmonary effort is normal. No respiratory distress.  Musculoskeletal:        General: Swelling and tenderness present.     Comments: She has mild diffuse effusion and edema around the left knee.  There is Baker's cyst.  The knee is stable but she has tenderness along the joint line that that appears to be more lateral than medial.  Skin:    Findings: No lesion or rash.  Neurological:     Mental Status: She is alert and  oriented to person, place, and time.  Psychiatric:        Speech: Speech normal.        Behavior: Behavior normal.        Thought Content: Thought content normal.       No results found for any visits on 03/31/22.  Assessment & Plan     1. Acute pain of left knee This time will x-ray knee and have her use a knee sleeve for some comfort and continue to ice it and alternate with heat.  We will try naproxen 500 mg twice a day as needed for pain and go ahead and refer to orthopedics. - DG Knee Complete 4 Views Left; Future   No follow-ups on file.      I, Wilhemena Durie, MD, have reviewed all documentation for this visit. The documentation on 04/01/22 for the exam, diagnosis, procedures, and orders are all accurate and complete.    Nalleli Largent Cranford Mon, MD  [facility] [phone] (phone) [phone] (fax)  [place]

## 2022-04-02 ENCOUNTER — Other Ambulatory Visit: Payer: Self-pay | Admitting: Cardiovascular Disease

## 2022-04-18 ENCOUNTER — Other Ambulatory Visit: Payer: Self-pay | Admitting: Obstetrics and Gynecology

## 2022-04-18 ENCOUNTER — Other Ambulatory Visit: Payer: Self-pay | Admitting: Family Medicine

## 2022-04-18 DIAGNOSIS — F419 Anxiety disorder, unspecified: Secondary | ICD-10-CM

## 2022-04-19 MED ORDER — ALPRAZOLAM 0.5 MG PO TABS: 0.5000 mg | ORAL_TABLET | Freq: Two times a day (BID) | ORAL | 0 refills | Status: DC | PRN

## 2022-04-19 MED ORDER — NAPROXEN 500 MG PO TABS: 500.0000 mg | ORAL_TABLET | Freq: Two times a day (BID) | ORAL | 0 refills | Status: DC

## 2022-04-30 ENCOUNTER — Other Ambulatory Visit: Payer: Self-pay | Admitting: Cardiovascular Disease

## 2022-05-29 ENCOUNTER — Other Ambulatory Visit: Payer: Self-pay | Admitting: Cardiovascular Disease

## 2022-06-04 ENCOUNTER — Ambulatory Visit (INDEPENDENT_AMBULATORY_CARE_PROVIDER_SITE_OTHER): Payer: No Typology Code available for payment source | Admitting: Physician Assistant

## 2022-06-04 ENCOUNTER — Encounter: Payer: Self-pay | Admitting: Physician Assistant

## 2022-06-04 VITALS — BP 129/70 | HR 85 | Ht 65.0 in | Wt 176.3 lb

## 2022-06-04 DIAGNOSIS — R8271 Bacteriuria: Secondary | ICD-10-CM | POA: Diagnosis not present

## 2022-06-04 DIAGNOSIS — R1011 Right upper quadrant pain: Secondary | ICD-10-CM

## 2022-06-04 LAB — POCT URINALYSIS DIPSTICK
Bilirubin, UA: NEGATIVE
Blood, UA: NEGATIVE
Glucose, UA: NEGATIVE
Ketones, UA: NEGATIVE
Nitrite, UA: NEGATIVE
Protein, UA: POSITIVE — AB
Spec Grav, UA: 1.01 (ref 1.010–1.025)
Urobilinogen, UA: 0.2 E.U./dL
pH, UA: 6.5 (ref 5.0–8.0)

## 2022-06-04 MED ORDER — TRAMADOL HCL 50 MG PO TABS: 50.0000 mg | ORAL_TABLET | Freq: Three times a day (TID) | ORAL | 0 refills | Status: DC | PRN

## 2022-06-04 MED ORDER — ONDANSETRON HCL 4 MG PO TABS: 4.0000 mg | ORAL_TABLET | Freq: Three times a day (TID) | ORAL | 0 refills | Status: DC | PRN

## 2022-06-04 NOTE — Progress Notes (Signed)
I,Sha'taria Tyson,acting as a Education administrator for [employer], PA-C.,have documented all relevant documentation on the behalf of Mikey Kirschner, PA-C,as directed by  Mikey Kirschner, PA-C while in the presence of Mikey Kirschner, PA-C.   Established patient visit   Patient: Mary Christian   DOB: 12/20/1960   61 y.o. Female  MRN: [id number] Visit Date: 06/04/2022  Today's healthcare provider: Mikey Kirschner, PA-C   Cc. Abdominal pain, n/v  Subjective    HPI    Patient reports 8 out of 10 sharp cramping/spasming abdominal pain in the right upper quadrant that radiates to her back starting 3 days ago.  Associated with nausea and one episode of vomiting.  Pain is worse when she lies flat.  She has doubled her naproxen prescription is taking at 1000 mg 2 times a day.  This is not fully controlling her pain.  She reports some associated urinary frequency but denies dysuria.  Denies hematuria.   Medications: Outpatient Medications Prior to Visit  Medication Sig   ALPRAZolam (XANAX) 0.5 MG tablet Take 1 tablet (0.5 mg total) by mouth 2 (two) times daily as needed for anxiety.   amLODipine (NORVASC) 10 MG tablet TAKE ONE TABLET BY MOUTH EVERY DAY   CALCIUM CITRATE-VITAMIN D PO Take 630 mg by mouth daily.   doxazosin (CARDURA) 2 MG tablet TAKE 1 TABLET BY MOUTH DAILY   ezetimibe (ZETIA) 10 MG tablet Take 1 tablet (10 mg total) by mouth daily.   losartan (COZAAR) 100 MG tablet TAKE ONE TABLET EVERY DAY   naproxen (NAPROSYN) 500 MG tablet Take 1 tablet (500 mg total) by mouth 2 (two) times daily with a meal. Ass needed for pain.   omeprazole (PRILOSEC) 20 MG capsule Take 20 mg by mouth daily.   PARoxetine (PAXIL) 20 MG tablet Take 1 tablet (20 mg total) by mouth daily.   rosuvastatin (CRESTOR) 20 MG tablet TAKE ONE TABLET BY MOUTH EVERY DAY   No facility-administered medications prior to visit.    Review of Systems  Constitutional:  Negative for fatigue and fever.  Respiratory:  Negative for  cough and shortness of breath.   Cardiovascular:  Negative for chest pain and leg swelling.  Gastrointestinal:  Positive for abdominal pain, nausea and vomiting.  Genitourinary:  Positive for frequency. Negative for dysuria and hematuria.  Neurological:  Negative for dizziness and headaches.      Objective    BP 129/70   Pulse 85   Ht '5\' 5"'$  (1.651 m)   Wt 176 lb 4.8 oz (80 kg)   LMP 06/16/2016   SpO2 98%   BMI 29.34 kg/m  Blood pressure 129/70, pulse 85, height '5\' 5"'$  (1.651 m), weight 176 lb 4.8 oz (80 kg), last menstrual period 06/16/2016, SpO2 98 %.   Physical Exam Vitals reviewed.  Constitutional:      Appearance: She is not ill-appearing.  HENT:     Head: Normocephalic.  Eyes:     Conjunctiva/sclera: Conjunctivae normal.  Cardiovascular:     Rate and Rhythm: Normal rate.  Pulmonary:     Effort: Pulmonary effort is normal. No respiratory distress.  Abdominal:     Tenderness: There is abdominal tenderness in the right upper quadrant and epigastric area. Positive signs include Murphy's sign.  Neurological:     General: No focal deficit present.     Mental Status: She is alert and oriented to person, place, and time.  Psychiatric:        Mood and Affect: Mood normal.  Behavior: Behavior normal.      No results found for any visits on 06/04/22.  Assessment & Plan     Problem List Items Addressed This Visit       Other   RUQ pain - Primary    Suspicion for gallbladder disease -cbc, cmp, lipase Ordered stat RUQ ultrasound Increase fluids, bland diet/fluids rx tramadol instead of alleve and zofran for nausea Strict ED precautions if no improvement or worsening of symptoms may be her best option      Relevant Medications   traMADol (ULTRAM) 50 MG tablet   ondansetron (ZOFRAN) 4 MG tablet   Other Relevant Orders   Comprehensive Metabolic Panel (CMET)   CBC w/Diff/Platelet   Lipase   POCT Urinalysis Dipstick   US Abdomen Limited RUQ (LIVER/GB)    Bacteriuria    UA + leuk, trace protein Sent for culture will hold off on therapy until results      Relevant Orders   Urine Culture     Return if symptoms worsen or fail to improve.      I, Mikey Kirschner, PA-C have reviewed all documentation for this visit. The documentation on  06/04/2022 or the exam, diagnosis, procedures, and orders are all accurate and complete.  Mikey Kirschner, PA-C [facility] [address] [place], [place], [address] Office: [phone] Fax: [place]

## 2022-06-04 NOTE — Assessment & Plan Note (Signed)
Suspicion for gallbladder disease -cbc, cmp, lipase Ordered stat RUQ ultrasound Increase fluids, bland diet/fluids rx tramadol instead of alleve and zofran for nausea Strict ED precautions if no improvement or worsening of symptoms may be her best option

## 2022-06-04 NOTE — Assessment & Plan Note (Signed)
UA + leuk, trace protein Sent for culture will hold off on therapy until results

## 2022-06-05 LAB — CBC WITH DIFFERENTIAL/PLATELET
Basophils Absolute: 0 10*3/uL (ref 0.0–0.2)
Basos: 0 %
EOS (ABSOLUTE): 0 10*3/uL (ref 0.0–0.4)
Eos: 1 %
Hematocrit: 37 % (ref 34.0–46.6)
Hemoglobin: 12.7 g/dL (ref 11.1–15.9)
Immature Grans (Abs): 0 10*3/uL (ref 0.0–0.1)
Immature Granulocytes: 0 %
Lymphocytes Absolute: 0.8 10*3/uL (ref 0.7–3.1)
Lymphs: 10 %
MCH: 32.5 pg (ref 26.6–33.0)
MCHC: 34.3 g/dL (ref 31.5–35.7)
MCV: 95 fL (ref 79–97)
Monocytes Absolute: 1.2 10*3/uL — ABNORMAL HIGH (ref 0.1–0.9)
Monocytes: 15 %
Neutrophils Absolute: 5.7 10*3/uL (ref 1.4–7.0)
Neutrophils: 74 %
Platelets: 193 10*3/uL (ref 150–450)
RBC: 3.91 x10E6/uL (ref 3.77–5.28)
RDW: 13.1 % (ref 11.7–15.4)
WBC: 7.8 10*3/uL (ref 3.4–10.8)

## 2022-06-05 LAB — COMPREHENSIVE METABOLIC PANEL
ALT: 26 IU/L (ref 0–32)
AST: 23 IU/L (ref 0–40)
Albumin/Globulin Ratio: 1.6 (ref 1.2–2.2)
Albumin: 4.5 g/dL (ref 3.8–4.9)
Alkaline Phosphatase: 87 IU/L (ref 44–121)
BUN/Creatinine Ratio: 16 (ref 9–23)
BUN: 20 mg/dL (ref 6–24)
Bilirubin Total: 0.5 mg/dL (ref 0.0–1.2)
CO2: 20 mmol/L (ref 20–29)
Calcium: 9.9 mg/dL (ref 8.7–10.2)
Chloride: 100 mmol/L (ref 96–106)
Creatinine, Ser: 1.26 mg/dL — ABNORMAL HIGH (ref 0.57–1.00)
Globulin, Total: 2.9 g/dL (ref 1.5–4.5)
Glucose: 104 mg/dL — ABNORMAL HIGH (ref 70–99)
Potassium: 4.6 mmol/L (ref 3.5–5.2)
Sodium: 138 mmol/L (ref 134–144)
Total Protein: 7.4 g/dL (ref 6.0–8.5)
eGFR: 49 mL/min/{1.73_m2} — ABNORMAL LOW (ref >59)

## 2022-06-05 LAB — LIPASE: Lipase: 24 U/L (ref 14–72)

## 2022-06-07 ENCOUNTER — Encounter: Payer: Self-pay | Admitting: Physician Assistant

## 2022-06-07 ENCOUNTER — Ambulatory Visit
Admission: RE | Admit: 2022-06-07 | Discharge: 2022-06-07 | Disposition: A | Discharge status: Home / Self Care | Payer: No Typology Code available for payment source | Source: Ambulatory Visit | Attending: Physician Assistant | Admitting: Physician Assistant

## 2022-06-07 ENCOUNTER — Ambulatory Visit: Payer: Self-pay

## 2022-06-07 DIAGNOSIS — J189 Pneumonia, unspecified organism: Secondary | ICD-10-CM | POA: Diagnosis not present

## 2022-06-07 DIAGNOSIS — A419 Sepsis, unspecified organism: Secondary | ICD-10-CM | POA: Diagnosis not present

## 2022-06-07 DIAGNOSIS — R1011 Right upper quadrant pain: Secondary | ICD-10-CM | POA: Insufficient documentation

## 2022-06-07 NOTE — Telephone Encounter (Signed)
Patient's husband called back- he is upset they have not heard back from office- wife is in pain. They are requesting hospital admission for further testing- advised providers do not admit patients- they have to be admitted by ED for further care- they will go to ED now due to pain.

## 2022-06-07 NOTE — Telephone Encounter (Signed)
  Chief Complaint: abdominal pain Symptoms: abdominal pain R sided, 10/10 constant Frequency: 5-6 days Pertinent Negatives: NA Disposition: '[]'$ ED /'[]'$ Urgent Care (no appt availability in office) / '[]'$ Appointment(In office/virtual)/ '[]'$  [place] [facility]/ '[]'$ Home Care/ '[]'$ Refused Recommended Disposition /'[]'$  Mobile Bus/ '[x]'$  Follow-up with PCP Additional Notes: pt's husband calling, he is wanting to know what pt needs to do for pain since [place] didn't show much. Pt is still in severe pain. I advised that since this has been ongoing for 5-6 days going to ED for further eval would be best but I could confirm with provider first. Jeneen Rinks preferred I do that. Called and spoke with [place], [facility] no CMAs were available so she advised sending message back [facility] and have someone fu with him. I notified him of this information and he preferred to see what someone said first before just going to ED.  Reason for Disposition  [1] SEVERE pain (e.g., excruciating) AND [2] present > 1 hour  Answer Assessment - Initial Assessment Questions 1. LOCATION: "Where does it hurt?"      All on R side of abdomen  3. ONSET: "When did the pain begin?" (e.g., minutes, hours or days ago)      5-6 days 5. PATTERN "Does the pain come and go, or is it constant?"    - If it comes and goes: "How long does it last?" "Do you have pain now?"     (Note: Comes and goes means the pain is intermittent. It goes away completely between bouts.)    - If constant: "Is it getting better, staying the same, or getting worse?"      (Note: Constant means the pain never goes away completely; most serious pain is constant and gets worse.)      constant 6. SEVERITY: "How bad is the pain?"  (e.g., Scale 1-10; mild, moderate, or severe)    - MILD (1-3): Doesn't interfere with normal activities, abdomen soft and not tender to touch.     - MODERATE (4-7): Interferes with normal activities or awakens from sleep, abdomen tender to touch.     - SEVERE  (8-10): Excruciating pain, doubled over, unable to do any normal activities.       10/10  Protocols used: Abdominal Pain - Female-A-AH

## 2022-06-08 ENCOUNTER — Emergency Department: Payer: No Typology Code available for payment source

## 2022-06-08 ENCOUNTER — Inpatient Hospital Stay
Admission: EM | Admit: 2022-06-08 | Discharge: 2022-06-12 | DRG: 871 | Disposition: A | Discharge status: Home / Self Care | Payer: No Typology Code available for payment source | Attending: Internal Medicine | Admitting: Internal Medicine

## 2022-06-08 ENCOUNTER — Other Ambulatory Visit: Payer: Self-pay

## 2022-06-08 DIAGNOSIS — K219 Gastro-esophageal reflux disease without esophagitis: Secondary | ICD-10-CM | POA: Diagnosis present

## 2022-06-08 DIAGNOSIS — F419 Anxiety disorder, unspecified: Secondary | ICD-10-CM | POA: Diagnosis present

## 2022-06-08 DIAGNOSIS — Z79899 Other long term (current) drug therapy: Secondary | ICD-10-CM

## 2022-06-08 DIAGNOSIS — R1011 Right upper quadrant pain: Secondary | ICD-10-CM

## 2022-06-08 DIAGNOSIS — Z20822 Contact with and (suspected) exposure to covid-19: Secondary | ICD-10-CM | POA: Diagnosis present

## 2022-06-08 DIAGNOSIS — J9601 Acute respiratory failure with hypoxia: Secondary | ICD-10-CM | POA: Diagnosis not present

## 2022-06-08 DIAGNOSIS — R109 Unspecified abdominal pain: Secondary | ICD-10-CM | POA: Diagnosis present

## 2022-06-08 DIAGNOSIS — E785 Hyperlipidemia, unspecified: Secondary | ICD-10-CM | POA: Diagnosis present

## 2022-06-08 DIAGNOSIS — Z87891 Personal history of nicotine dependence: Secondary | ICD-10-CM

## 2022-06-08 DIAGNOSIS — K5900 Constipation, unspecified: Secondary | ICD-10-CM | POA: Diagnosis present

## 2022-06-08 DIAGNOSIS — Z8249 Family history of ischemic heart disease and other diseases of the circulatory system: Secondary | ICD-10-CM

## 2022-06-08 DIAGNOSIS — J189 Pneumonia, unspecified organism: Secondary | ICD-10-CM | POA: Diagnosis not present

## 2022-06-08 DIAGNOSIS — Z8041 Family history of malignant neoplasm of ovary: Secondary | ICD-10-CM

## 2022-06-08 DIAGNOSIS — A419 Sepsis, unspecified organism: Principal | ICD-10-CM | POA: Diagnosis present

## 2022-06-08 DIAGNOSIS — Z800 Family history of malignant neoplasm of digestive organs: Secondary | ICD-10-CM

## 2022-06-08 DIAGNOSIS — I10 Essential (primary) hypertension: Secondary | ICD-10-CM | POA: Diagnosis present

## 2022-06-08 DIAGNOSIS — Z8349 Family history of other endocrine, nutritional and metabolic diseases: Secondary | ICD-10-CM

## 2022-06-08 DIAGNOSIS — Z808 Family history of malignant neoplasm of other organs or systems: Secondary | ICD-10-CM

## 2022-06-08 LAB — COMPREHENSIVE METABOLIC PANEL
ALT: 30 U/L (ref 0–44)
AST: 24 U/L (ref 15–41)
Albumin: 4.2 g/dL (ref 3.5–5.0)
Alkaline Phosphatase: 78 U/L (ref 38–126)
Anion gap: 15 (ref 5–15)
BUN: 14 mg/dL (ref 6–20)
CO2: 22 mmol/L (ref 22–32)
Calcium: 9.6 mg/dL (ref 8.9–10.3)
Chloride: 100 mmol/L (ref 98–111)
Creatinine, Ser: 1.21 mg/dL — ABNORMAL HIGH (ref 0.44–1.00)
GFR, Estimated: 52 mL/min — ABNORMAL LOW (ref >60)
Glucose, Bld: 115 mg/dL — ABNORMAL HIGH (ref 70–99)
Potassium: 4.1 mmol/L (ref 3.5–5.1)
Sodium: 137 mmol/L (ref 135–145)
Total Bilirubin: 1 mg/dL (ref 0.3–1.2)
Total Protein: 8.2 g/dL — ABNORMAL HIGH (ref 6.5–8.1)

## 2022-06-08 LAB — CBC WITH DIFFERENTIAL/PLATELET
Abs Immature Granulocytes: 0.02 10*3/uL (ref 0.00–0.07)
Basophils Absolute: 0 10*3/uL (ref 0.0–0.1)
Basophils Relative: 0 %
Eosinophils Absolute: 0 10*3/uL (ref 0.0–0.5)
Eosinophils Relative: 0 %
HCT: 37.6 % (ref 36.0–46.0)
Hemoglobin: 12.6 g/dL (ref 12.0–15.0)
Immature Granulocytes: 0 %
Lymphocytes Relative: 12 %
Lymphs Abs: 0.9 10*3/uL (ref 0.7–4.0)
MCH: 31.8 pg (ref 26.0–34.0)
MCHC: 33.5 g/dL (ref 30.0–36.0)
MCV: 94.9 fL (ref 80.0–100.0)
Monocytes Absolute: 1.1 10*3/uL — ABNORMAL HIGH (ref 0.1–1.0)
Monocytes Relative: 15 %
Neutro Abs: 5.5 10*3/uL (ref 1.7–7.7)
Neutrophils Relative %: 73 %
Platelets: 269 10*3/uL (ref 150–400)
RBC: 3.96 MIL/uL (ref 3.87–5.11)
RDW: 12.3 % (ref 11.5–15.5)
WBC: 7.6 10*3/uL (ref 4.0–10.5)
nRBC: 0 % (ref 0.0–0.2)

## 2022-06-08 LAB — URINALYSIS, ROUTINE W REFLEX MICROSCOPIC
Bilirubin Urine: NEGATIVE
Glucose, UA: NEGATIVE mg/dL
Hgb urine dipstick: NEGATIVE
Ketones, ur: 20 mg/dL — AB
Leukocytes,Ua: NEGATIVE
Nitrite: NEGATIVE
Protein, ur: NEGATIVE mg/dL
Specific Gravity, Urine: >1.046 — ABNORMAL HIGH (ref 1.005–1.030)
pH: 6 (ref 5.0–8.0)

## 2022-06-08 LAB — LIPASE, BLOOD: Lipase: 26 U/L (ref 11–51)

## 2022-06-08 LAB — HCG, QUANTITATIVE, PREGNANCY: hCG, Beta Chain, Quant, S: 8 m[IU]/mL — ABNORMAL HIGH (ref <5)

## 2022-06-08 LAB — SPECIMEN STATUS REPORT

## 2022-06-08 LAB — URINE CULTURE

## 2022-06-08 LAB — SARS CORONAVIRUS 2 BY RT PCR: SARS Coronavirus 2 by RT PCR: NEGATIVE

## 2022-06-08 MED ORDER — PANTOPRAZOLE SODIUM 40 MG PO TBEC
40.0000 mg | DELAYED_RELEASE_TABLET | Freq: Every day | ORAL | Status: DC
  Administered 2022-06-09 – 2022-06-12 (×4): 40 mg via ORAL
  Filled 2022-06-08 (×4): qty 1

## 2022-06-08 MED ORDER — MORPHINE SULFATE (PF) 4 MG/ML IV SOLN
4.0000 mg | Freq: Once | INTRAVENOUS | Status: AC
  Administered 2022-06-08: 4 mg via INTRAVENOUS
  Filled 2022-06-08: qty 1

## 2022-06-08 MED ORDER — AMLODIPINE BESYLATE 10 MG PO TABS
10.0000 mg | ORAL_TABLET | Freq: Every day | ORAL | Status: DC
  Administered 2022-06-09 – 2022-06-12 (×4): 10 mg via ORAL
  Filled 2022-06-08 (×3): qty 1
  Filled 2022-06-08: qty 2

## 2022-06-08 MED ORDER — TRAZODONE HCL 50 MG PO TABS: 25.0000 mg | ORAL_TABLET | Freq: Every evening | ORAL | Status: DC | PRN

## 2022-06-08 MED ORDER — SODIUM CHLORIDE 0.9 % IV SOLN
500.0000 mg | INTRAVENOUS | Status: DC
  Administered 2022-06-09 – 2022-06-10 (×2): 500 mg via INTRAVENOUS
  Filled 2022-06-08 (×2): qty 500

## 2022-06-08 MED ORDER — POLYETHYLENE GLYCOL 3350 17 G PO PACK: 17.0000 g | PACK | Freq: Every day | ORAL | 0 refills | Status: DC

## 2022-06-08 MED ORDER — IOHEXOL 300 MG/ML  SOLN
100.0000 mL | Freq: Once | INTRAMUSCULAR | Status: AC | PRN
  Administered 2022-06-08: 100 mL via INTRAVENOUS

## 2022-06-08 MED ORDER — ONDANSETRON HCL 4 MG/2ML IJ SOLN: 4.0000 mg | Freq: Four times a day (QID) | INTRAMUSCULAR | Status: DC | PRN

## 2022-06-08 MED ORDER — LOSARTAN POTASSIUM 50 MG PO TABS
100.0000 mg | ORAL_TABLET | Freq: Every day | ORAL | Status: DC
  Administered 2022-06-09 – 2022-06-12 (×4): 100 mg via ORAL
  Filled 2022-06-08 (×4): qty 2

## 2022-06-08 MED ORDER — EZETIMIBE 10 MG PO TABS
10.0000 mg | ORAL_TABLET | Freq: Every day | ORAL | Status: DC
  Administered 2022-06-09 – 2022-06-12 (×4): 10 mg via ORAL
  Filled 2022-06-08 (×4): qty 1

## 2022-06-08 MED ORDER — ACETAMINOPHEN 650 MG RE SUPP: 650.0000 mg | Freq: Four times a day (QID) | RECTAL | Status: DC | PRN

## 2022-06-08 MED ORDER — ALPRAZOLAM 0.5 MG PO TABS
0.5000 mg | ORAL_TABLET | Freq: Two times a day (BID) | ORAL | Status: DC | PRN
  Administered 2022-06-09 – 2022-06-12 (×4): 0.5 mg via ORAL
  Filled 2022-06-08 (×4): qty 1

## 2022-06-08 MED ORDER — FENTANYL CITRATE PF 50 MCG/ML IJ SOSY
50.0000 ug | PREFILLED_SYRINGE | Freq: Once | INTRAMUSCULAR | Status: AC
  Administered 2022-06-08: 50 ug via INTRAVENOUS
  Filled 2022-06-08: qty 1

## 2022-06-08 MED ORDER — LEVOFLOXACIN IN D5W 500 MG/100ML IV SOLN
500.0000 mg | Freq: Once | INTRAVENOUS | Status: AC
  Administered 2022-06-08: 500 mg via INTRAVENOUS
  Filled 2022-06-08: qty 100

## 2022-06-08 MED ORDER — MAGNESIUM CITRATE PO SOLN: 1.0000 | Freq: Once | ORAL | 1 refills | Status: AC

## 2022-06-08 MED ORDER — ALUM & MAG HYDROXIDE-SIMETH 200-200-20 MG/5ML PO SUSP
30.0000 mL | Freq: Once | ORAL | Status: AC
  Administered 2022-06-08: 30 mL via ORAL
  Filled 2022-06-08: qty 30

## 2022-06-08 MED ORDER — ENOXAPARIN SODIUM 40 MG/0.4ML IJ SOSY
40.0000 mg | PREFILLED_SYRINGE | INTRAMUSCULAR | Status: DC
  Administered 2022-06-08 – 2022-06-11 (×4): 40 mg via SUBCUTANEOUS
  Filled 2022-06-08 (×4): qty 0.4

## 2022-06-08 MED ORDER — HALOPERIDOL LACTATE 5 MG/ML IJ SOLN
5.0000 mg | Freq: Once | INTRAMUSCULAR | Status: AC
  Administered 2022-06-08: 5 mg via INTRAVENOUS
  Filled 2022-06-08: qty 1

## 2022-06-08 MED ORDER — OYSTER SHELL CALCIUM/D3 500-5 MG-MCG PO TABS
1.0000 | ORAL_TABLET | Freq: Every day | ORAL | Status: DC
  Administered 2022-06-09 – 2022-06-12 (×4): 1 via ORAL
  Filled 2022-06-08 (×4): qty 1

## 2022-06-08 MED ORDER — TRAMADOL HCL 50 MG PO TABS
50.0000 mg | ORAL_TABLET | Freq: Three times a day (TID) | ORAL | Status: DC | PRN
  Administered 2022-06-08 – 2022-06-10 (×5): 50 mg via ORAL
  Filled 2022-06-08 (×5): qty 1

## 2022-06-08 MED ORDER — ACETAMINOPHEN 325 MG PO TABS
650.0000 mg | ORAL_TABLET | Freq: Four times a day (QID) | ORAL | Status: DC | PRN
  Administered 2022-06-08 – 2022-06-11 (×8): 650 mg via ORAL
  Filled 2022-06-08 (×9): qty 2

## 2022-06-08 MED ORDER — SODIUM CHLORIDE 0.9 % IV SOLN
2.0000 g | INTRAVENOUS | Status: DC
  Administered 2022-06-08 – 2022-06-11 (×4): 2 g via INTRAVENOUS
  Filled 2022-06-08: qty 20
  Filled 2022-06-08: qty 2
  Filled 2022-06-08 (×2): qty 20

## 2022-06-08 MED ORDER — PAROXETINE HCL 20 MG PO TABS
20.0000 mg | ORAL_TABLET | Freq: Every day | ORAL | Status: DC
  Administered 2022-06-09 – 2022-06-12 (×4): 20 mg via ORAL
  Filled 2022-06-08 (×4): qty 1

## 2022-06-08 MED ORDER — ROSUVASTATIN CALCIUM 10 MG PO TABS
20.0000 mg | ORAL_TABLET | Freq: Every day | ORAL | Status: DC
  Administered 2022-06-09 – 2022-06-12 (×4): 20 mg via ORAL
  Filled 2022-06-08 (×2): qty 2
  Filled 2022-06-08: qty 1
  Filled 2022-06-08: qty 2

## 2022-06-08 MED ORDER — MAGNESIUM HYDROXIDE 400 MG/5ML PO SUSP: 30.0000 mL | Freq: Every day | ORAL | Status: DC | PRN

## 2022-06-08 MED ORDER — ONDANSETRON HCL 4 MG PO TABS: 4.0000 mg | ORAL_TABLET | Freq: Four times a day (QID) | ORAL | Status: DC | PRN

## 2022-06-08 MED ORDER — LIDOCAINE VISCOUS HCL 2 % MT SOLN
15.0000 mL | Freq: Once | OROMUCOSAL | Status: AC
  Administered 2022-06-08: 15 mL via ORAL
  Filled 2022-06-08: qty 15

## 2022-06-08 MED ORDER — SODIUM CHLORIDE 0.9 % IV SOLN: INTRAVENOUS | Status: DC

## 2022-06-08 MED ORDER — ONDANSETRON HCL 4 MG PO TABS: 4.0000 mg | ORAL_TABLET | Freq: Three times a day (TID) | ORAL | Status: DC | PRN

## 2022-06-08 MED ORDER — DOXAZOSIN MESYLATE 2 MG PO TABS
2.0000 mg | ORAL_TABLET | Freq: Every day | ORAL | Status: DC
  Administered 2022-06-09 – 2022-06-12 (×4): 2 mg via ORAL
  Filled 2022-06-08 (×4): qty 1

## 2022-06-08 NOTE — Assessment & Plan Note (Addendum)
-   This could be related to atelectasis and possible to the past to be developing bibasal community-acquired pneumonia especially given her fever at home. - We will place on IV Rocephin and Zithromax for now. - We will obtain a D-dimer and if positive obtain a chest CTA to rule out PE.

## 2022-06-08 NOTE — ED Triage Notes (Addendum)
Pt here with abd pain since Thursday. Pt states she had a recent US that showed gallbladder sludge. Pt state the back pain started Thursday as well. Pt states she is also having nausea. Pt states she feels SOB as well.

## 2022-06-08 NOTE — Assessment & Plan Note (Signed)
-   We will continue her statin therapy. 

## 2022-06-08 NOTE — Assessment & Plan Note (Signed)
-   The patient be admitted to a medical observation bed. - Her constipation is likely the culprit for her abdominal pain. - We will aggressively manage her constipation. - Abdominal and pelvic CT scan was otherwise unremarkable.

## 2022-06-08 NOTE — Assessment & Plan Note (Signed)
-   We will continue her antihypertensives while monitoring her BP.

## 2022-06-08 NOTE — ED Notes (Signed)
Patient reports back pain.  Requesting medication.  Medicated per PRN order

## 2022-06-08 NOTE — Assessment & Plan Note (Signed)
-   We will continue Xanax and Paxil.

## 2022-06-08 NOTE — ED Notes (Signed)
Pain been ongoing for 5 days. R & L side of back. Pt denies any possible injuries. Pain onset middle of the night last Thursday.

## 2022-06-08 NOTE — ED Provider Notes (Signed)
Patient excepted in transfer.  She had a lot of belly pain starting 5 days ago.  She is having some nausea and decreased bowel movement.  CT shows a lot of stool in the colon my review this it looks like diarrhea is a large part of it.  Additionally nurse reported when the patient got up and walked to the bathroom to the toilet inside of the room her heart rate went up into the 130s.  Her oxygen saturation dropped as well.  When we walked the patient back and forth in the room her oxygen saturation dropped down to 82 after brief period of walking.  This was on room air.  Currently her belly is somewhat distended but not tender to palpation or percussion.  I will give her some Levaquin which should cover both the pneumonia and any atypicals and most bacterial infectious causes of diarrhea.   Nena Polio, MD 06/08/22 [phone]

## 2022-06-08 NOTE — Assessment & Plan Note (Signed)
-   We will continue PPI therapy.  This could be contributing to her abdominal discomfort.

## 2022-06-08 NOTE — ED Provider Notes (Signed)
[facility] Provider Note    Event Date/Time   First MD Initiated Contact with Patient 06/08/22 1022     (approximate)   History   Abdominal Pain   HPI  LEXXI KOSLOW is a 61 y.o. female  who presents to the emergency department today because of concern for abdominal pain. Pain started 5 days ago. Initially located in the right upper quadrant it has now spread throughout most of her stomach. She has had associated nausea. Decreased bowel movements. Had been in contact with her PCP who had an US performed yesterday which showed gallbladder sludge but no findings concerning for cholecystitis. The patient denies any unusual ingestions prior to the pain starting. Denies any abdominal surgeries.       Physical Exam   Triage Vital Signs: ED Triage Vitals  Enc Vitals Group     BP 06/08/22 0929 125/72     Pulse Rate 06/08/22 0929 97     Resp 06/08/22 0929 18     Temp 06/08/22 0929 98.8 F (37.1 C)     Temp Source 06/08/22 0929 Oral     SpO2 06/08/22 0929 90 %     Weight 06/08/22 0930 176 lb 5.9 oz (80 kg)     Height 06/08/22 0930 '5\' 5"'$  (1.651 m)     Head Circumference --      Peak Flow --      Pain Score --      Pain Loc --      Pain Edu? --      Excl. in [place]? --     Most recent vital signs: Vitals:   06/08/22 0929  BP: 125/72  Pulse: 97  Resp: 18  Temp: 98.8 F (37.1 C)  SpO2: 90%    General: Awake, alert, oriented. CV:  Good peripheral perfusion. Regular rate and rhythm. Resp:  Normal effort. Lungs clear. Abd:  No distention. Tender to palpation somewhat diffusely, worse in the right upper quadrant.     ED Results / Procedures / Treatments   Labs (all labs ordered are listed, but only abnormal results are displayed) Labs Reviewed  COMPREHENSIVE METABOLIC PANEL - Abnormal; Notable for the following components:      Result Value   Glucose, Bld 115 (*)    Creatinine, Ser 1.21 (*)    Total Protein 8.2 (*)    GFR, Estimated 52 (*)     All other components within normal limits  CBC WITH DIFFERENTIAL/PLATELET - Abnormal; Notable for the following components:   Monocytes Absolute 1.1 (*)    All other components within normal limits  HCG, QUANTITATIVE, PREGNANCY - Abnormal; Notable for the following components:   hCG, Beta Chain, Quant, S 8 (*)    All other components within normal limits  URINALYSIS, ROUTINE W REFLEX MICROSCOPIC - Abnormal; Notable for the following components:   Color, Urine YELLOW (*)    APPearance CLEAR (*)    Specific Gravity, Urine >1.046 (*)    Ketones, ur 20 (*)    All other components within normal limits  LIPASE, BLOOD     EKG  I, Nance Pear, attending physician, personally viewed and interpreted this EKG  EKG Time: 0933 Rate: 103 Rhythm: sinus tachycardia  Axis: left axis deviation Intervals: qtc 440 QRS: narrow ST changes: no st elevation Impression: abnormal ekg    RADIOLOGY I independently interpreted and visualized the CT abd/pel. My interpretation: no free air Radiology interpretation:  IMPRESSION:  Mild bilateral posterior basilar atelectasis  or infiltrates are  noted.    Severe left renal cortical atrophy is noted. Nonobstructive left  renal calculus is noted. No hydronephrosis or renal obstruction is  noted.    Large amount of stool is seen in the cecum and right colon. There is  no definite evidence of bowel obstruction or inflammation.    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    Aortic Atherosclerosis (ICD10-I70.0).      PROCEDURES:  Critical Care performed: No  Procedures   MEDICATIONS ORDERED IN ED: Medications - No data to display   IMPRESSION / MDM / [place] / ED COURSE  I reviewed the triage vital signs and the nursing notes.                              Differential diagnosis includes, but is not limited to, gallbladder disease, ulcer, gastroenteritis, perforation.  Patient's  presentation is most consistent with acute presentation with potential threat to life or bodily function.  Patient presents to the emergency department today because of concern for abdominal pain. Patient had recent US which was concerning for some sludge but no signs of cholecystitis. Blood work without leukocytosis, no elevation of lipase or hepatic function panel. CT scan was obtained which did not show any concerning infection or inflammation. Did show large amount of stool in the right side. UA without signs concerning for infection. At this time do wonder if stool burden is causing patient's pain. Will try enema here. Will then plan on prescribing laxatives.    FINAL CLINICAL IMPRESSION(S) / ED DIAGNOSES   Final diagnoses:  Abdominal pain, unspecified abdominal location  Constipation, unspecified constipation type     Note:  This document was prepared using Dragon voice recognition software and may include unintentional dictation errors.    Nance Pear, MD 06/08/22 1536

## 2022-06-08 NOTE — ED Notes (Signed)
Pt removed from O2 and ambulated around room. Pt sats dropped to 87% and HR increased to 130s. Dr. Cinda Quest at Bedside during ambulating trial. Pt assisted back to bed o2 put back on 2l.

## 2022-06-08 NOTE — H&P (Signed)
[place]   PATIENT NAME: Mary Christian    MR#:  [id number]  DATE OF BIRTH:  01/20/1961  DATE OF ADMISSION:  06/08/2022  PRIMARY CARE PHYSICIAN: Jerrol Banana., MD   Patient is coming from: Home  REQUESTING/REFERRING PHYSICIAN: Conni Slipper, MD  CHIEF COMPLAINT:   Chief Complaint  Patient presents with   Abdominal Pain    HISTORY OF PRESENT ILLNESS:  Mary Christian is a 61 y.o. Caucasian female with medical history significant for anxiety, GERD, hypertension and hiatal hernia, who presented to the emergency room with a [place] of upper abdominal pain with recent constipation.  She admitted to tactile fever and chills with a temperature of 99.9 at home.  She has been having dyspnea worsening with exertion but denies any cough or wheezing.  She denied any chest pain or palpitations.  No dysuria, oliguria or hematuria or flank pain.  She is not on home oxygen.  The patient has been having much better relief for her abdominal discomfort after having a big bowel movement with Fleet enema given in the ER.  No melena or bright red bleeding per rectum.  ED Course: When in the ER vital signs were within normal except for pulse oximetry of 91% on Room air and later on 2 L then it came up to 94% on 2 L of O2.  Labs revealed a creatinine 1.2 close to previous level 4 days and otherwise unremarkable CMP.  CBC was within normal.  Serum pregnancy test was negative. EKG as reviewed by me : EKG showed sinus tachycardia with a rate of 103 with fusion complexes. Imaging: Two-view chest x-ray showed low lung volumes and bibasilar atelectasis with no acute findings.  She had an abdominal pelvic CT scan revealing mild bilateral posterior basal atelectasis or infiltrates, severe left renal cortical atrophy, nonobstructing left renal calculus with no hydronephrosis or renal obstruction.  It showed large amount of stool in the cecum and right colon with no definite evidence of bowel obstruction or  inflammation.  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  The patient was given p.o. Maalox, IV Levaquin and 4 mg of IV morphine sulfate, 50 mcg of IV fentanyl and viscous lidocaine as well as 5 mg of IV Haldol.  She will be admitted to a medical telemetry observation bed for further evaluation and management. PAST MEDICAL HISTORY:   Past Medical History:  Diagnosis Date   Abnormal vaginal bleeding    Anxiety    Cervical leiomyoma 02/2016   removed surg   GERD (gastroesophageal reflux disease)    Hiatal hernia    Hypertension    Renal insufficiency     PAST SURGICAL HISTORY:   Past Surgical History:  Procedure Laterality Date   CESAREAN SECTION     COLONOSCOPY  08/04/2006   Dr Bary Castilla   COLONOSCOPY WITH PROPOFOL N/A 06/15/2019   Procedure: COLONOSCOPY WITH Biopsy;  Surgeon: Lucilla Lame, MD;  Location: [facility];  Service: Endoscopy;  Laterality: N/A;   DILATATION & CURETTAGE/HYSTEROSCOPY WITH MYOSURE N/A 03/05/2016   Procedure: DILATATION & CURETTAGE/HYSTEROSCOPY WITH MYOSURE;  Surgeon: Will Bonnet, MD;  Location: [facility];  Service: Gynecology;  Laterality: N/A;   DILATION AND CURETTAGE OF UTERUS     POLYPECTOMY N/A 06/15/2019   Procedure: POLYPECTOMY;  Surgeon: Lucilla Lame, MD;  Location: [facility];  Service: Endoscopy;  Laterality: N/A;  Clip x1  placed as site of Ascending Colon  Polyp Removal   TUBAL LIGATION     UPPER GI ENDOSCOPY  08/04/2006   Dr Bary Castilla    SOCIAL HISTORY:   Social History   Tobacco Use   Smoking status: Former    Packs/day: 1.00    Years: 3.00    Total pack years: 3.00    Types: Cigarettes    Quit date: 05/01/1985    Years since quitting: 37.1   Smokeless tobacco: Never  Substance Use Topics   Alcohol use: Yes    Alcohol/week: 14.0 standard drinks of alcohol    Types: 14 Glasses of wine per week    FAMILY HISTORY:   Family History  Problem Relation Age of Onset   Hypertension  Father    Heart disease Father        CABG x 3   Heart attack Father 63   Colon cancer Father    Liver cancer Father    Hypertension Sister    Hyperlipidemia Sister    Skin cancer Sister        62s   Hypertension Brother    Hypertension Sister    Hyperlipidemia Sister    Hypertension Mother    Colon cancer Mother 40       with mets.   Heart Problems Maternal Grandfather    Colon cancer Paternal Grandfather    Ovarian cancer Paternal Aunt 42   Breast cancer Neg Hx     DRUG ALLERGIES:  No Known Allergies  REVIEW OF SYSTEMS:   ROS As per history of present illness. All pertinent systems were reviewed above. Constitutional, HEENT, cardiovascular, respiratory, GI, GU, musculoskeletal, neuro, psychiatric, endocrine, integumentary and hematologic systems were reviewed and are otherwise negative/unremarkable except for positive findings mentioned above in the HPI.   MEDICATIONS AT HOME:   Prior to Admission medications   Medication Sig Start Date End Date Taking? Authorizing Provider  magnesium citrate SOLN Take 296 mLs (1 Bottle total) by mouth once for 1 dose. 06/08/22 06/08/22 Yes Nance Pear, MD  polyethylene glycol (MIRALAX) 17 g packet Take 17 g by mouth daily. 06/08/22  Yes Nance Pear, MD  ALPRAZolam Duanne Moron) 0.5 MG tablet Take 1 tablet (0.5 mg total) by mouth 2 (two) times daily as needed for anxiety. 8/46/96   Copland, Elmo Putt B, PA-C  amLODipine (NORVASC) 10 MG tablet TAKE ONE TABLET BY MOUTH EVERY DAY 01/15/22   Minna Merritts, MD  CALCIUM CITRATE-VITAMIN D PO Take 630 mg by mouth daily.    [provider]  doxazosin (CARDURA) 2 MG tablet TAKE 1 TABLET BY MOUTH DAILY 04/30/22   Minna Merritts, MD  ezetimibe (ZETIA) 10 MG tablet Take 1 tablet (10 mg total) by mouth daily. 01/22/22   Minna Merritts, MD  losartan (COZAAR) 100 MG tablet TAKE ONE TABLET EVERY DAY 12/08/21   Minna Merritts, MD  naproxen (NAPROSYN) 500 MG tablet Take 1 tablet (500 mg  total) by mouth 2 (two) times daily with a meal. Ass needed for pain. 04/19/22   Jerrol Banana., MD  omeprazole (PRILOSEC) 20 MG capsule Take 20 mg by mouth daily.    [provider]  ondansetron (ZOFRAN) 4 MG tablet Take 1 tablet (4 mg total) by mouth every 8 (eight) hours as needed for nausea or vomiting. 06/04/22   Thedore Mins, Ria Comment, PA-C  PARoxetine (PAXIL) 20 MG tablet Take 1 tablet (20 mg total) by mouth daily. 10/29/50   Copland, Alicia B, PA-C  rosuvastatin (CRESTOR) 20 MG tablet TAKE  ONE TABLET BY MOUTH EVERY DAY 11/11/21   Minna Merritts, MD  traMADol (ULTRAM) 50 MG tablet Take 1 tablet (50 mg total) by mouth every 8 (eight) hours as needed for up to 5 days. 06/04/22 06/09/22  Mikey Kirschner, PA-C      VITAL SIGNS:  Blood pressure (!) 150/78, pulse (!) 110, temperature (!) 102.9 F (39.4 C), temperature source Oral, resp. rate 18, height '5\' 5"'$  (1.651 m), weight 80 kg, last menstrual period 06/16/2016, SpO2 94 %.  PHYSICAL EXAMINATION:  Physical Exam  GENERAL:  61 y.o.-year-old Caucasian female patient lying in the bed with no acute distress.  EYES: Pupils equal, round, reactive to light and accommodation. No scleral icterus. Extraocular muscles intact.  HEENT: Head atraumatic, normocephalic. Oropharynx and nasopharynx clear.  NECK:  Supple, no jugular venous distention. No thyroid enlargement, no tenderness.  LUNGS: Mild diminished bibasilar breath sounds with bibasal crackles.  No use of accessory muscles of respiration.  CARDIOVASCULAR: Regular rate and rhythm, S1, S2 normal. No murmurs, rubs, or gallops.  ABDOMEN: Soft, nondistended, nontender. Bowel sounds present. No organomegaly or mass.  EXTREMITIES: No pedal edema, cyanosis, or clubbing.  NEUROLOGIC: Cranial nerves II through XII are intact. Muscle strength 5/5 in all extremities. Sensation intact. Gait not checked.  PSYCHIATRIC: The patient is alert and oriented x 3.  Normal affect and good eye contact. SKIN:  No obvious rash, lesion, or ulcer.   LABORATORY PANEL:   CBC Recent Labs  Lab 06/08/22 0934  WBC 7.6  HGB 12.6  HCT 37.6  PLT 269   ------------------------------------------------------------------------------------------------------------------  Chemistries  Recent Labs  Lab 06/08/22 0934  NA 137  K 4.1  CL 100  CO2 22  GLUCOSE 115*  BUN 14  CREATININE 1.21*  CALCIUM 9.6  AST 24  ALT 30  ALKPHOS 78  BILITOT 1.0   ------------------------------------------------------------------------------------------------------------------  Cardiac Enzymes No results for input(s): "TROPONINI" in the last 168 hours. ------------------------------------------------------------------------------------------------------------------  RADIOLOGY:  DG Chest 2 View  Result Date: 06/08/2022 CLINICAL DATA:  Back pain, gallbladder sludge, nausea, shortness of breath EXAM: CHEST - 2 VIEW COMPARISON:  03/10/2012, 06/08/2022 FINDINGS: Frontal and lateral views of the chest demonstrate an unremarkable cardiac silhouette. Areas of bibasilar atelectasis are again noted. No airspace disease, effusion, or pneumothorax. Lung volumes are diminished. No acute bony abnormalities. IMPRESSION: 1. Low lung volumes and bibasilar atelectasis. No acute airspace disease. Electronically Signed   By: Randa Ngo M.D.   On: 06/08/2022 17:26   CT ABDOMEN PELVIS W CONTRAST  Result Date: 06/08/2022 CLINICAL DATA:  Right upper quadrant abdominal pain. EXAM: CT ABDOMEN AND PELVIS WITH CONTRAST TECHNIQUE: Multidetector CT imaging of the abdomen and pelvis was performed using the standard protocol following bolus administration of intravenous contrast. RADIATION DOSE REDUCTION: This exam was performed according to the departmental dose-optimization program which includes automated exposure control, adjustment of the mA and/or kV according to patient size and/or use of iterative reconstruction technique. CONTRAST:  154m  OMNIPAQUE IOHEXOL 300 MG/ML  SOLN COMPARISON:  June 07, 2022. FINDINGS: Lower chest: Mild bilateral posterior basilar atelectasis or infiltrates are noted. Hepatobiliary: No focal liver abnormality is seen. No gallstones, gallbladder wall thickening, or biliary dilatation. Pancreas: Unremarkable. No pancreatic ductal dilatation or surrounding inflammatory changes. Spleen: Probable 1 cm calcified splenic artery aneurysm is noted. The spleen itself is unremarkable. Adrenals/Urinary Tract: Adrenal glands appear normal. Severe left renal cortical atrophy is noted. Nonobstructive left renal calculus is noted. No hydronephrosis or renal obstruction is noted. Urinary bladder  is decompressed. Stomach/Bowel: The stomach and appendix are unremarkable. There is no evidence of bowel obstruction. Large amount of stool is seen in the cecum and right colon. Vascular/Lymphatic: Aortic atherosclerosis. No enlarged abdominal or pelvic lymph nodes. Reproductive: Uterus and bilateral adnexa are unremarkable. Other: No abdominal wall hernia or abnormality. No abdominopelvic ascites. Musculoskeletal: No acute or significant osseous findings. IMPRESSION: Mild bilateral posterior basilar atelectasis or infiltrates are noted. Severe left renal cortical atrophy is noted. Nonobstructive left renal calculus is noted. No hydronephrosis or renal obstruction is noted. Large amount of stool is seen in the cecum and right colon. There is no definite evidence of bowel obstruction or inflammation. 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 Aortic Atherosclerosis (ICD10-I70.0). Electronically Signed   By: Marijo Conception M.D.   On: 06/08/2022 12:07   US Abdomen Limited RUQ (LIVER/GB)  Result Date: 06/07/2022 CLINICAL DATA:  Right upper quadrant abdominal pain over the last 5 days. EXAM: ULTRASOUND ABDOMEN LIMITED RIGHT UPPER QUADRANT COMPARISON:  None FINDINGS: Gallbladder: No Murphy sign. No wall thickening or  surrounding fluid. Very small amount of gallbladder sludge. No shadowing stone. Common bile duct: Diameter: 3 mm, normal. Liver: Echogenic liver suggesting fatty change. No focal lesion or ductal dilatation. Portal vein is patent on color Doppler imaging with normal direction of blood flow towards the liver. Other: None. IMPRESSION: Increased echogenicity of the liver suggesting steatosis. No focal lesion or ductal dilatation. No gallstones or sonographic Murphy sign. Very tiny amount of sludge in the gallbladder. Electronically Signed   By: Nelson Chimes M.D.   On: 06/07/2022 09:29      IMPRESSION AND PLAN:  Assessment and Plan: * Abdominal pain - The patient be admitted to a medical observation bed. - Her constipation is likely the culprit for her abdominal pain. - We will aggressively manage her constipation. - Abdominal and pelvic CT scan was otherwise unremarkable.  Acute respiratory failure with hypoxia (HCC) - This could be related to atelectasis and possible to the past to be developing bibasal community-acquired pneumonia especially given her fever at home. - We will place on IV Rocephin and Zithromax for now. - We will obtain a D-dimer and if positive obtain a chest CTA to rule out PE.  GERD without esophagitis - We will continue PPI therapy.  This could be contributing to her abdominal discomfort.  Dyslipidemia - We will continue her statin therapy  Anxiety - We will continue Xanax and Paxil.  Essential hypertension - We will continue her antihypertensives while monitoring her BP.     DVT prophylaxis: Lovenox.  Advanced Care Planning:  Code Status: full code.  Family Communication:  The plan of care was discussed in details with the patient (and family). I answered all questions. The patient agreed to proceed with the above mentioned plan. Further management will depend upon hospital course. Disposition Plan: Back to previous home environment Consults called: none.  All  the records are reviewed and case discussed with ED provider.  Status is: Observation  I certify that at the time of admission, it is my clinical judgment that the patient will require hospital care extending less than 2 midnights.                            Dispo: The patient is from: Home              Anticipated d/c is to: Home  Patient currently is not medically stable to d/c.              Difficult to place patient: No  Christel Mormon M.D on 06/08/2022 at 8:20 PM  [facility]   From 7 PM-7 AM, contact night-coverage [email or web address]  CC: Primary care physician; Jerrol Banana., MD

## 2022-06-08 NOTE — Discharge Instructions (Addendum)
Please seek medical attention for any high fevers, chest pain, shortness of breath, change in behavior, persistent vomiting, bloody stool or any other new or concerning symptoms.  

## 2022-06-09 ENCOUNTER — Observation Stay: Payer: No Typology Code available for payment source

## 2022-06-09 ENCOUNTER — Encounter: Payer: Self-pay | Admitting: Family Medicine

## 2022-06-09 DIAGNOSIS — F419 Anxiety disorder, unspecified: Secondary | ICD-10-CM | POA: Diagnosis present

## 2022-06-09 DIAGNOSIS — E785 Hyperlipidemia, unspecified: Secondary | ICD-10-CM | POA: Diagnosis present

## 2022-06-09 DIAGNOSIS — J189 Pneumonia, unspecified organism: Secondary | ICD-10-CM | POA: Diagnosis present

## 2022-06-09 DIAGNOSIS — Z8249 Family history of ischemic heart disease and other diseases of the circulatory system: Secondary | ICD-10-CM | POA: Diagnosis not present

## 2022-06-09 DIAGNOSIS — Z808 Family history of malignant neoplasm of other organs or systems: Secondary | ICD-10-CM | POA: Diagnosis not present

## 2022-06-09 DIAGNOSIS — Z8041 Family history of malignant neoplasm of ovary: Secondary | ICD-10-CM | POA: Diagnosis not present

## 2022-06-09 DIAGNOSIS — K5900 Constipation, unspecified: Secondary | ICD-10-CM | POA: Diagnosis present

## 2022-06-09 DIAGNOSIS — K219 Gastro-esophageal reflux disease without esophagitis: Secondary | ICD-10-CM | POA: Diagnosis present

## 2022-06-09 DIAGNOSIS — Z8349 Family history of other endocrine, nutritional and metabolic diseases: Secondary | ICD-10-CM | POA: Diagnosis not present

## 2022-06-09 DIAGNOSIS — I10 Essential (primary) hypertension: Secondary | ICD-10-CM | POA: Diagnosis present

## 2022-06-09 DIAGNOSIS — Z87891 Personal history of nicotine dependence: Secondary | ICD-10-CM | POA: Diagnosis not present

## 2022-06-09 DIAGNOSIS — R1084 Generalized abdominal pain: Secondary | ICD-10-CM | POA: Diagnosis not present

## 2022-06-09 DIAGNOSIS — Z79899 Other long term (current) drug therapy: Secondary | ICD-10-CM | POA: Diagnosis not present

## 2022-06-09 DIAGNOSIS — A419 Sepsis, unspecified organism: Secondary | ICD-10-CM | POA: Diagnosis present

## 2022-06-09 DIAGNOSIS — Z20822 Contact with and (suspected) exposure to covid-19: Secondary | ICD-10-CM | POA: Diagnosis present

## 2022-06-09 DIAGNOSIS — Z800 Family history of malignant neoplasm of digestive organs: Secondary | ICD-10-CM | POA: Diagnosis not present

## 2022-06-09 DIAGNOSIS — J9601 Acute respiratory failure with hypoxia: Secondary | ICD-10-CM | POA: Diagnosis present

## 2022-06-09 LAB — HIV ANTIBODY (ROUTINE TESTING W REFLEX): HIV Screen 4th Generation wRfx: NONREACTIVE

## 2022-06-09 LAB — BASIC METABOLIC PANEL
Anion gap: 10 (ref 5–15)
BUN: 9 mg/dL (ref 6–20)
CO2: 23 mmol/L (ref 22–32)
Calcium: 9.1 mg/dL (ref 8.9–10.3)
Chloride: 99 mmol/L (ref 98–111)
Creatinine, Ser: 0.95 mg/dL (ref 0.44–1.00)
GFR, Estimated: >60 mL/min (ref >60)
Glucose, Bld: 144 mg/dL — ABNORMAL HIGH (ref 70–99)
Potassium: 3.8 mmol/L (ref 3.5–5.1)
Sodium: 132 mmol/L — ABNORMAL LOW (ref 135–145)

## 2022-06-09 LAB — C-REACTIVE PROTEIN: CRP: 17.1 mg/dL — ABNORMAL HIGH (ref <1.0)

## 2022-06-09 LAB — D-DIMER, QUANTITATIVE: D-Dimer, Quant: 2.58 ug/mL-FEU — ABNORMAL HIGH (ref 0.00–0.50)

## 2022-06-09 LAB — PROCALCITONIN: Procalcitonin: 0.32 ng/mL

## 2022-06-09 LAB — SEDIMENTATION RATE: Sed Rate: 60 mm/hr — ABNORMAL HIGH (ref 0–30)

## 2022-06-09 LAB — MAGNESIUM: Magnesium: 1.7 mg/dL (ref 1.7–2.4)

## 2022-06-09 MED ORDER — IOHEXOL 350 MG/ML SOLN
75.0000 mL | Freq: Once | INTRAVENOUS | Status: AC | PRN
  Administered 2022-06-09: 75 mL via INTRAVENOUS

## 2022-06-09 MED ORDER — DOCUSATE SODIUM 100 MG PO CAPS
100.0000 mg | ORAL_CAPSULE | Freq: Two times a day (BID) | ORAL | Status: DC
  Administered 2022-06-09 – 2022-06-12 (×7): 100 mg via ORAL
  Filled 2022-06-09 (×7): qty 1

## 2022-06-09 MED ORDER — POLYETHYLENE GLYCOL 3350 17 G PO PACK
17.0000 g | PACK | Freq: Every day | ORAL | Status: DC
  Administered 2022-06-09 – 2022-06-12 (×4): 17 g via ORAL
  Filled 2022-06-09 (×4): qty 1

## 2022-06-09 MED ORDER — ONDANSETRON HCL 4 MG/2ML IJ SOLN: 4.0000 mg | Freq: Four times a day (QID) | INTRAMUSCULAR | Status: DC | PRN

## 2022-06-09 NOTE — Progress Notes (Signed)
   06/09/22 1608  Assess: MEWS Score  BP (!) 149/71  MAP (mmHg) 92  Pulse Rate (!) 112  Resp 20  Level of Consciousness Alert  SpO2 94 %  O2 Device Nasal Cannula  O2 Flow Rate (L/min) 2 L/min  Assess: MEWS Score  MEWS Temp 1  MEWS Systolic 0  MEWS Pulse 2  MEWS RR 0  MEWS LOC 0  MEWS Score 3  MEWS Score Color Yellow  Assess: if the MEWS score is Yellow or Red  Were vital signs taken at a resting state? Yes  Focused Assessment No change from prior assessment  Does the patient meet 2 or more of the SIRS criteria? Yes  Does the patient have a confirmed or suspected source of infection? Yes  Provider and Rapid Response Notified? Yes  MEWS guidelines implemented *See Row Information* Yes  Treat  MEWS Interventions Escalated (See documentation below);Administered prn meds/treatments  Pain Scale 0-10  Pain Score 4  Pain Type Chronic pain  Pain Location Back  Pain Orientation Lower  Pain Descriptors / Indicators Aching;Discomfort  Pain Intervention(s) Medication (See eMAR)  Multiple Pain Sites No  Take Vital Signs  Increase Vital Sign Frequency  Yellow: Q 2hr X 2 then Q 4hr X 2, if remains yellow, continue Q 4hrs  Escalate  MEWS: Escalate Yellow: discuss with charge nurse/RN and consider discussing with provider and RRT  Notify: Charge Nurse/RN  Name of Charge Nurse/RN Notified Jessi  Date Charge Nurse/RN Notified 06/09/22  Time Charge Nurse/RN Notified 7  Notify: Provider  Provider Name/Title Ptel  Date Provider Notified 06/09/22  Time Provider Notified 36  Method of Notification Page  Notification Reason Other (Comment) (yellow mews)  Provider response No new orders  Document  Patient Outcome Not stable and remains on department  Progress note created (see row info) Yes  Assess: SIRS CRITERIA  SIRS Temperature  0  SIRS Pulse 1  SIRS Respirations  0  SIRS WBC 1  SIRS Score Sum  2

## 2022-06-09 NOTE — Progress Notes (Signed)
End of shift note:  Pt was admitted to the floor. IVF were started. Pt is on 2L [place] (acute), c/o of SOB at rest. Prn tylenol was given for t-max 101.1. Now pt's temperature is 97.8.

## 2022-06-09 NOTE — ED Notes (Signed)
Patient reports increased anxiety and HA.  Medicated with PRN order

## 2022-06-09 NOTE — ED Notes (Signed)
Patient complaining of mid back pain.  States pain is an 8/10.  Patient medicated per PRN order.

## 2022-06-09 NOTE — Hospital Course (Signed)
PMH of anxiety, GERD, HTN, hiatal hernia present to the hospital with complaints of abdominal pain.  Found to have constipation causing abdominal pain but also has sepsis likely from community-acquired pneumonia.

## 2022-06-09 NOTE — ED Notes (Signed)
Patient requesting to have temperature retaken.  States "I am freezing again."  Temperature currently 99.7.  Patient reminded that she requested to have temperature in room decreased earlier.  Room temperature adjusted and door to room opened at this time

## 2022-06-09 NOTE — ED Notes (Signed)
Patient requesting to have temperature in room lowered.  Informed patient that temperature was already at a low setting.  Patient requested it be turned lower.  Temperature in room adjusted per request.  Patient and visitor updated on plan of care.  Understanding voiced

## 2022-06-09 NOTE — Progress Notes (Signed)
  Progress Note Patient: Mary Christian DUK:[id number] DOB: [birth year] DOA: 06/08/2022  DOS: the patient was seen and examined on 06/09/2022  Brief hospital course: PMH of anxiety, GERD, HTN, hiatal hernia present to the hospital with complaints of abdominal pain.  Found to have constipation causing abdominal pain but also has sepsis likely from community-acquired pneumonia.  Assessment and Plan: Severe constipation. Abdominal pain. Nausea. GERD. Continue stool regimen. CT abdomen shows no other acute abnormality. Abdominal pain has improved.  Acute hypoxic respiratory failure. Community-acquired pneumonia. Sepsis present on admission. Mildly hypoxic at the time of admission. Chest x-ray shows atelectasis. Patient had fever tachycardia and tachypnea meeting SIRS criteria. Blood cultures not performed on admission. Continue with antibiotics. Check procalcitonin level. CT PE protocol negative for pulmonary embolism but does show evidence of bilateral basal atelectasis/pneumonia. Monitor.  HTN. Currently on amlodipine and losartan which I will continue.  Anxiety. Continue Xanax and Paxil.  HLD. Continue statin.  Subjective: Feeling somewhat better but continues to remain fatigued and have chills and have fever.  No chest pain.  No abdominal pain.  Has some cough.  Physical Exam: Vitals:   06/09/22 1100 06/09/22 1608 06/09/22 1608 06/09/22 1656  BP: 138/80  (!) 149/71 138/68  Pulse: (!) 117  (!) 112 (!) 114  Resp: '20  20 16  '$ Temp: 100.2 F (37.9 C) (!) 100.7 F (38.2 C)  (!) 101.1 F (38.4 C)  TempSrc: Oral Oral    SpO2: 94%  94% 94%  Weight:      Height:       General: Appear in moderate distress; no visible Abnormal Neck Mass Or lumps, Conjunctiva normal Cardiovascular: S1 and S2 Present, no Murmur, Respiratory: increased respiratory effort, Bilateral Air entry present and faint Crackles, no wheezes Abdomen: Bowel Sound present, Non tender  Extremities: no Pedal  edema Neurology: alert and oriented to time, place, and person  Gait not checked due to patient safety concerns   Data Reviewed: I have Reviewed nursing notes, Vitals, and Lab results since pt's last encounter. Pertinent lab results CBC BMP D-dimer I have ordered test including CBC and BMP procalcitonin level I have ordered imaging studies CT PE protocol.   Family Communication: Husband at bedside  Disposition: Status is: Inpatient Remains inpatient appropriate because: Need IV antibiotics and close observation  Author: Berle Mull, MD 06/09/2022 5:58 PM  Please look on [email or web address] to find out who is on call.

## 2022-06-09 NOTE — ED Notes (Signed)
Patient reports HA and chills.  Medicated for pain and temperature at this time

## 2022-06-10 ENCOUNTER — Inpatient Hospital Stay: Payer: No Typology Code available for payment source

## 2022-06-10 DIAGNOSIS — R1084 Generalized abdominal pain: Secondary | ICD-10-CM | POA: Diagnosis not present

## 2022-06-10 LAB — RESPIRATORY PANEL BY PCR

## 2022-06-10 LAB — BASIC METABOLIC PANEL
Anion gap: 7 (ref 5–15)
BUN: 9 mg/dL (ref 6–20)
CO2: 26 mmol/L (ref 22–32)
Calcium: 9 mg/dL (ref 8.9–10.3)
Chloride: 103 mmol/L (ref 98–111)
Creatinine, Ser: 0.89 mg/dL (ref 0.44–1.00)
GFR, Estimated: >60 mL/min (ref >60)
Glucose, Bld: 127 mg/dL — ABNORMAL HIGH (ref 70–99)
Potassium: 3.9 mmol/L (ref 3.5–5.1)
Sodium: 136 mmol/L (ref 135–145)

## 2022-06-10 LAB — COMPREHENSIVE METABOLIC PANEL
ALT: 32 U/L (ref 0–44)
AST: 47 U/L — ABNORMAL HIGH (ref 15–41)
Albumin: 3 g/dL — ABNORMAL LOW (ref 3.5–5.0)
Alkaline Phosphatase: 54 U/L (ref 38–126)
Anion gap: 12 (ref 5–15)
BUN: 9 mg/dL (ref 6–20)
CO2: 22 mmol/L (ref 22–32)
Calcium: 8.9 mg/dL (ref 8.9–10.3)
Chloride: 101 mmol/L (ref 98–111)
Creatinine, Ser: 0.92 mg/dL (ref 0.44–1.00)
GFR, Estimated: >60 mL/min (ref >60)
Glucose, Bld: 133 mg/dL — ABNORMAL HIGH (ref 70–99)
Potassium: 3.3 mmol/L — ABNORMAL LOW (ref 3.5–5.1)
Sodium: 135 mmol/L (ref 135–145)
Total Bilirubin: 0.8 mg/dL (ref 0.3–1.2)
Total Protein: 6.7 g/dL (ref 6.5–8.1)

## 2022-06-10 LAB — PROCALCITONIN
Procalcitonin: 0.37 ng/mL
Procalcitonin: 0.47 ng/mL

## 2022-06-10 LAB — CBC WITH DIFFERENTIAL/PLATELET
Abs Immature Granulocytes: 0.03 10*3/uL (ref 0.00–0.07)
Abs Immature Granulocytes: 0.02 10*3/uL (ref 0.00–0.07)
Basophils Absolute: 0 10*3/uL (ref 0.0–0.1)
Basophils Absolute: 0 10*3/uL (ref 0.0–0.1)
Basophils Relative: 1 %
Basophils Relative: 0 %
Eosinophils Absolute: 0 10*3/uL (ref 0.0–0.5)
Eosinophils Absolute: 0 10*3/uL (ref 0.0–0.5)
Eosinophils Relative: 0 %
Eosinophils Relative: 0 %
HCT: 33.6 % — ABNORMAL LOW (ref 36.0–46.0)
HCT: 35.8 % — ABNORMAL LOW (ref 36.0–46.0)
Hemoglobin: 11.3 g/dL — ABNORMAL LOW (ref 12.0–15.0)
Hemoglobin: 12.2 g/dL (ref 12.0–15.0)
Immature Granulocytes: 1 %
Immature Granulocytes: 1 %
Lymphocytes Relative: 5 %
Lymphocytes Relative: 8 %
Lymphs Abs: 0.2 10*3/uL — ABNORMAL LOW (ref 0.7–4.0)
Lymphs Abs: 0.3 10*3/uL — ABNORMAL LOW (ref 0.7–4.0)
MCH: 31.7 pg (ref 26.0–34.0)
MCH: 32.4 pg (ref 26.0–34.0)
MCHC: 33.6 g/dL (ref 30.0–36.0)
MCHC: 34.1 g/dL (ref 30.0–36.0)
MCV: 94.1 fL (ref 80.0–100.0)
MCV: 95.2 fL (ref 80.0–100.0)
Monocytes Absolute: 0.5 10*3/uL (ref 0.1–1.0)
Monocytes Absolute: 0.5 10*3/uL (ref 0.1–1.0)
Monocytes Relative: 13 %
Monocytes Relative: 13 %
Neutro Abs: 3.4 10*3/uL (ref 1.7–7.7)
Neutro Abs: 3.2 10*3/uL (ref 1.7–7.7)
Neutrophils Relative %: 80 %
Neutrophils Relative %: 78 %
Platelets: 178 10*3/uL (ref 150–400)
Platelets: 174 10*3/uL (ref 150–400)
RBC: 3.57 MIL/uL — ABNORMAL LOW (ref 3.87–5.11)
RBC: 3.76 MIL/uL — ABNORMAL LOW (ref 3.87–5.11)
RDW: 12.4 % (ref 11.5–15.5)
RDW: 12.5 % (ref 11.5–15.5)
WBC: 4.2 10*3/uL (ref 4.0–10.5)
WBC: 4 10*3/uL (ref 4.0–10.5)
nRBC: 0 % (ref 0.0–0.2)
nRBC: 0 % (ref 0.0–0.2)

## 2022-06-10 LAB — URINALYSIS, MICROSCOPIC (REFLEX): Bacteria, UA: NONE SEEN

## 2022-06-10 LAB — LACTIC ACID, PLASMA
Lactic Acid, Venous: 0.5 mmol/L (ref 0.5–1.9)
Lactic Acid, Venous: 0.7 mmol/L (ref 0.5–1.9)

## 2022-06-10 LAB — URINALYSIS, ROUTINE W REFLEX MICROSCOPIC
Bilirubin Urine: NEGATIVE
Glucose, UA: NEGATIVE mg/dL
Hgb urine dipstick: NEGATIVE
Ketones, ur: 15 mg/dL — AB
Leukocytes,Ua: NEGATIVE
Nitrite: NEGATIVE
Protein, ur: 30 mg/dL — AB
Specific Gravity, Urine: 1.015 (ref 1.005–1.030)
pH: 7 (ref 5.0–8.0)

## 2022-06-10 LAB — MAGNESIUM: Magnesium: 1.9 mg/dL (ref 1.7–2.4)

## 2022-06-10 MED ORDER — TRAMADOL HCL 50 MG PO TABS
50.0000 mg | ORAL_TABLET | Freq: Three times a day (TID) | ORAL | Status: DC | PRN
  Administered 2022-06-10 – 2022-06-12 (×3): 50 mg via ORAL
  Filled 2022-06-10 (×3): qty 1

## 2022-06-10 MED ORDER — ALUM & MAG HYDROXIDE-SIMETH 200-200-20 MG/5ML PO SUSP: 15.0000 mL | ORAL | Status: DC | PRN

## 2022-06-10 MED ORDER — IBUPROFEN 400 MG PO TABS
400.0000 mg | ORAL_TABLET | Freq: Once | ORAL | Status: AC
  Administered 2022-06-10: 400 mg via ORAL
  Filled 2022-06-10: qty 1

## 2022-06-10 MED ORDER — MORPHINE SULFATE (PF) 2 MG/ML IV SOLN
2.0000 mg | INTRAVENOUS | Status: DC | PRN
  Administered 2022-06-10 – 2022-06-12 (×4): 2 mg via INTRAVENOUS
  Filled 2022-06-10 (×4): qty 1

## 2022-06-10 MED ORDER — KETOROLAC TROMETHAMINE 15 MG/ML IJ SOLN
15.0000 mg | Freq: Three times a day (TID) | INTRAMUSCULAR | Status: DC
  Administered 2022-06-10 – 2022-06-12 (×5): 15 mg via INTRAVENOUS
  Filled 2022-06-10 (×5): qty 1

## 2022-06-10 NOTE — Plan of Care (Signed)
  Problem: Health Behavior/Discharge Planning: Goal: Ability to manage health-related needs will improve Outcome: Progressing   

## 2022-06-10 NOTE — Progress Notes (Signed)
  Progress Note Patient: Mary Christian DQQ:[id number] DOB: 1961-08-18 DOA: 06/08/2022  DOS: the patient was seen and examined on 06/10/2022  Brief hospital course: PMH of anxiety, GERD, HTN, hiatal hernia present to the hospital with complaints of abdominal pain.  Found to have constipation causing abdominal pain but also has sepsis likely from community-acquired pneumonia.  Assessment and Plan: Acute hypoxic respiratory failure. Community-acquired pneumonia. Sepsis present on admission. Mildly hypoxic at the time of admission. Chest x-ray shows atelectasis. Patient had fever tachycardia and tachypnea meeting SIRS criteria. Blood cultures not performed on admission. Continue with antibiotics. Pro-Cal elevated.  CRP elevated.  ESR elevated.  COVID-negative.  Respiratory virus pathogen panel negative. CT PE protocol negative for pulmonary embolism but does show evidence of bilateral basal.  Atelectasis/pneumonia. Continue current antibiotic.  If remains febrile will broaden antibiotic coverage to vancomycin and cefepime and consult ID.  Severe constipation. Abdominal pain. Nausea. GERD. Continue stool regimen. CT abdomen shows no other acute abnormality. Abdominal pain has improved.   HTN. Currently on amlodipine and losartan which I will continue.   Anxiety. Continue Xanax and Paxil.   HLD. Continue statin.  Subjective: No nausea no vomiting no fever no chills.  No chest pain.  Continues to have some back pain which is present on admission.  Continues to have fatigue and tiredness.  Had a fever last night again later today as well.  Physical Exam: Vitals:   06/10/22 0643 06/10/22 0759 06/10/22 1727 06/10/22 1754  BP:  135/79 (!) 154/79 (!) 145/77  Pulse:  (!) 109 (!) 111 (!) 104  Resp:  _0 Temp: 99.9 F (37.7 C) 100 F (37.8 C) (!) 102.6 F (39.2 C) 100.2 F (37.9 C)  TempSrc: Oral Oral  Oral  SpO2:  91% 96% 93%  Weight:      Height:       General: Appear in  moderate distress; no visible Abnormal Neck Mass Or lumps, Conjunctiva normal Cardiovascular: S1 and S2 Present, no Murmur, Respiratory: increased respiratory effort, Bilateral Air entry present and faint Crackles, no wheezes Abdomen: Bowel Sound present, Non tender  Extremities: no Pedal edema Neurology: alert and oriented to time, place, and person  Gait not checked due to patient safety concerns   Data Reviewed: I have Reviewed nursing notes, Vitals, and Lab results since pt's last encounter. Pertinent lab results CBC, BMP, procalcitonin, ESR, CRP I have ordered test including CBC and BMP    Family Communication: We will discuss with husband tomorrow.  Disposition: Status is: Inpatient Remains inpatient appropriate because: Need for IV antibiotics.  Ongoing fever.  Author: Berle Mull, MD 06/10/2022 7:10 PM  Please look on [email or web address] to find out who is on call.

## 2022-06-10 NOTE — TOC Initial Note (Signed)
Transition of Care [facility]) - Initial/Assessment Note    Patient Details  Name: Mary Christian MRN: [id number] Date of Birth: [birth year]  Transition of Care [facility]) CM/SW Contact:    Beverly Sessions, RN Phone Number: 06/10/2022, 2:02 PM  Clinical Narrative:                     Transition of Care [facility]) Screening Note   Patient Details  Name: Mary Christian Date of Birth: 1961-04-21   Transition of Care [facility]) CM/SW Contact:    Beverly Sessions, RN Phone Number: 06/10/2022, 2:02 PM    Transition of Care Department [facility]) has reviewed patient and no TOC needs have been identified at this time. We will continue to monitor patient advancement through interdisciplinary progression rounds. If new patient transition needs arise, please place a TOC consult.  Patient currently on acute O2 2L, with plans to wean.  Please consult TOC if home O2 needed at discharge      Patient Goals and CMS Choice        Expected Discharge Plan and Services                                                Prior Living Arrangements/Services                       Activities of Daily Living Home Assistive Devices/Equipment: None ADL Screening (condition at time of admission) Patient's cognitive ability adequate to safely complete daily activities?: Yes Is the patient deaf or have difficulty hearing?: No Does the patient have difficulty seeing, even when wearing glasses/contacts?: No Does the patient have difficulty concentrating, remembering, or making decisions?: No Patient able to express need for assistance with ADLs?: Yes Does the patient have difficulty dressing or bathing?: No Independently performs ADLs?: Yes (appropriate for developmental age) Does the patient have difficulty walking or climbing stairs?: No Weakness of Legs: None Weakness of Arms/Hands: None  Permission Sought/Granted                  Emotional Assessment              Admission diagnosis:   Community acquired pneumonia [J18.9] Abdominal pain [R10.9] Abdominal pain, unspecified abdominal location [R10.9] Constipation, unspecified constipation type [K59.00] Patient Active Problem List   Diagnosis Date Noted   Abdominal pain 06/08/2022   Acute respiratory failure with hypoxia ([place]) 06/08/2022   Dyslipidemia 06/08/2022   GERD without esophagitis 06/08/2022   Community acquired pneumonia    RUQ pain 06/04/2022   Bacteriuria 06/04/2022   Vasomotor symptoms due to menopause 10/20/2020   Breast calcification seen on mammogram 10/17/2019   Polyp of ascending colon    Family history of colon cancer 09/25/2018   Special screening for malignant neoplasms, colon 04/04/2018   Fibroid of cervix 03/05/2016   Menorrhagia with irregular cycle 03/05/2016   Insomnia 01/30/2016   Essential hypertension 05/01/2014   Leg swelling 05/01/2014   Anxiety 05/01/2014   Benign essential HTN 02/24/2006   PCP:  Jerrol Banana., MD Pharmacy:   [place], [place] - [address] [place] [address] Phone: [phone] Fax: [phone]     Social Determinants of Health (SDOH) Interventions    Readmission Risk Interventions     No  data to display           

## 2022-06-10 NOTE — Progress Notes (Signed)
       CROSS COVER NOTE  NAME: Mary Christian MRN: [id number] DOB : 11-18-1960    Date of Service   06/10/2022   HPI/Events of Note   0015- RN gave tylenol earlier for temp of 103.1. Post tylenol was still high at 102.7. This is the max her temp has been since admitted. Previous temp ranging 99-101. Patient already on abx for CAP, however no blood cultures collected on admission. HR 110, 91% SpO2 on 2L [place].   Will order additional labs and imaging for infectious work up.   Interventions   Plan: LA, pro calcitonin, BC, UA, Cxray CMP, CBC Add on ibuprofen 400 mg PO x1 for fever.

## 2022-06-10 NOTE — Progress Notes (Signed)
Mobility Specialist - Progress Note   06/10/22 1438  Mobility  Activity Ambulated independently in room  Level of Assistance Independent  Assistive Device None  Distance Ambulated (ft) 40 ft  Activity Response Tolerated well  $Mobility charge 1 Mobility   Pt supine upon entry, utilizing [place] 1L. Pt indep competed bed mob. Pt ambulated 40 ft in room without AD, tolerated well. Pt left supine with needs within reach, family present at bedside. No complaints.   Candie Mile Mobility Specialist 06/10/22 2:40 PM

## 2022-06-10 NOTE — Progress Notes (Signed)
   06/10/22 1727  Assess: MEWS Score  Temp (!) 102.6 F (39.2 C)  BP (!) 154/79  MAP (mmHg) 99  Pulse Rate (!) 111  Resp 18  SpO2 96 %  O2 Device Room Air  Assess: MEWS Score  MEWS Temp 2  MEWS Systolic 0  MEWS Pulse 2  MEWS RR 0  MEWS LOC 0  MEWS Score 4  MEWS Score Color Red  Assess: if the MEWS score is Yellow or Red  Were vital signs taken at a resting state? Yes  Focused Assessment Change from prior assessment (see assessment flowsheet)  Does the patient meet 2 or more of the SIRS criteria? Yes  Does the patient have a confirmed or suspected source of infection? Yes  Provider and Rapid Response Notified? No  MEWS guidelines implemented *See Row Information* Yes  Treat  MEWS Interventions Escalated (See documentation below)  Pain Scale 0-10  Pain Score 10  Pain Type Chronic pain  Pain Location Back  Pain Orientation Lower  Take Vital Signs  Increase Vital Sign Frequency  Red: Q 1hr X 4 then Q 4hr X 4, if remains red, continue Q 4hrs  Escalate  MEWS: Escalate Red: discuss with charge nurse/RN and provider, consider discussing with RRT  Notify: Charge Nurse/RN  Name of Charge Nurse/RN Notified Hessie Dibble RN  Date Charge Nurse/RN Notified 06/10/22  Time Charge Nurse/RN Notified 1735  Notify: Provider  Provider Name/Title patel Diamantina Providence MD  Date Provider Notified 06/10/22  Time Provider Notified 1735  Method of Notification Page (secure chat)  Notification Reason Other (Comment) (red MEWS/ temp 102.6)  Provider response See new orders  Date of Provider Response 06/10/22  Time of Provider Response 1737  Notify: Rapid Response  Name of Rapid Response RN Notified Megan RN  Date Rapid Response Notified 06/10/22  Time Rapid Response Notified 1607  Assess: SIRS CRITERIA  SIRS Temperature  1  SIRS Pulse 1  SIRS Respirations  0  SIRS WBC 1  SIRS Score Sum  3   I was alerted by the NT for this room that this patient had a RED MEWS d/t Her temp of 102.6. RN for  this room was in another patient room at this time. MD and ICU charge nurse notified and new orders were given by MD.

## 2022-06-11 DIAGNOSIS — R1084 Generalized abdominal pain: Secondary | ICD-10-CM | POA: Diagnosis not present

## 2022-06-11 LAB — CBC WITH DIFFERENTIAL/PLATELET
Abs Immature Granulocytes: 0.01 10*3/uL (ref 0.00–0.07)
Basophils Absolute: 0 10*3/uL (ref 0.0–0.1)
Basophils Relative: 1 %
Eosinophils Absolute: 0 10*3/uL (ref 0.0–0.5)
Eosinophils Relative: 1 %
HCT: 34.6 % — ABNORMAL LOW (ref 36.0–46.0)
Hemoglobin: 11.8 g/dL — ABNORMAL LOW (ref 12.0–15.0)
Immature Granulocytes: 0 %
Lymphocytes Relative: 11 %
Lymphs Abs: 0.4 10*3/uL — ABNORMAL LOW (ref 0.7–4.0)
MCH: 31.1 pg (ref 26.0–34.0)
MCHC: 34.1 g/dL (ref 30.0–36.0)
MCV: 91.3 fL (ref 80.0–100.0)
Monocytes Absolute: 0.9 10*3/uL (ref 0.1–1.0)
Monocytes Relative: 24 %
Neutro Abs: 2.4 10*3/uL (ref 1.7–7.7)
Neutrophils Relative %: 63 %
Platelets: 196 10*3/uL (ref 150–400)
RBC: 3.79 MIL/uL — ABNORMAL LOW (ref 3.87–5.11)
RDW: 12.2 % (ref 11.5–15.5)
WBC: 3.8 10*3/uL — ABNORMAL LOW (ref 4.0–10.5)
nRBC: 0 % (ref 0.0–0.2)

## 2022-06-11 LAB — BASIC METABOLIC PANEL
Anion gap: 10 (ref 5–15)
BUN: 8 mg/dL (ref 6–20)
CO2: 24 mmol/L (ref 22–32)
Calcium: 9 mg/dL (ref 8.9–10.3)
Chloride: 102 mmol/L (ref 98–111)
Creatinine, Ser: 0.8 mg/dL (ref 0.44–1.00)
GFR, Estimated: >60 mL/min (ref >60)
Glucose, Bld: 107 mg/dL — ABNORMAL HIGH (ref 70–99)
Potassium: 3 mmol/L — ABNORMAL LOW (ref 3.5–5.1)
Sodium: 136 mmol/L (ref 135–145)

## 2022-06-11 LAB — MAGNESIUM: Magnesium: 1.8 mg/dL (ref 1.7–2.4)

## 2022-06-11 LAB — PROCALCITONIN: Procalcitonin: 0.37 ng/mL

## 2022-06-11 MED ORDER — AZITHROMYCIN 250 MG PO TABS
250.0000 mg | ORAL_TABLET | Freq: Once | ORAL | Status: AC
  Administered 2022-06-11: 250 mg via ORAL
  Filled 2022-06-11: qty 1

## 2022-06-11 MED ORDER — POTASSIUM CHLORIDE CRYS ER 20 MEQ PO TBCR
40.0000 meq | EXTENDED_RELEASE_TABLET | ORAL | Status: AC
  Administered 2022-06-11 (×2): 40 meq via ORAL
  Filled 2022-06-11 (×2): qty 2

## 2022-06-11 MED ORDER — AZITHROMYCIN 250 MG PO TABS
500.0000 mg | ORAL_TABLET | Freq: Every day | ORAL | Status: DC
  Administered 2022-06-12: 500 mg via ORAL
  Filled 2022-06-11: qty 2

## 2022-06-11 MED ORDER — AZITHROMYCIN 250 MG PO TABS
250.0000 mg | ORAL_TABLET | Freq: Every day | ORAL | Status: DC
  Administered 2022-06-11: 250 mg via ORAL
  Filled 2022-06-11: qty 1

## 2022-06-11 NOTE — Plan of Care (Signed)

## 2022-06-11 NOTE — Progress Notes (Signed)
SATURATION QUALIFICATIONS:  Patient Saturations on Room Air at Rest = 95%  Patient Saturations on Room Air while Ambulating = 95%

## 2022-06-11 NOTE — Progress Notes (Signed)
  Progress Note Patient: Mary Christian VOU:[id number] DOB: [birth year] DOA: 06/08/2022  DOS: the patient was seen and examined on 06/11/2022  Brief hospital course: PMH of anxiety, GERD, HTN, hiatal hernia present to the hospital with complaints of abdominal pain.  Found to have constipation causing abdominal pain but also has sepsis likely from community-acquired pneumonia.  Assessment and Plan: Acute hypoxic respiratory failure. Community-acquired pneumonia. Sepsis present on admission. Mildly hypoxic at the time of admission. Chest x-ray shows atelectasis. Patient had fever tachycardia and tachypnea meeting SIRS criteria. Blood cultures not performed on admission. Continue with antibiotics. Pro-Cal elevated.  CRP elevated.  ESR elevated.  COVID-negative.  Respiratory virus pathogen panel negative. CT PE protocol negative for pulmonary embolism but does show evidence of bilateral basal.  Atelectasis/pneumonia. Continue current antibiotic.  If remains febrile will broaden antibiotic coverage to vancomycin and cefepime and consult ID.  If remains afebrile today most likely will go home tomorrow.  Severe constipation. Abdominal pain. Nausea. GERD. Continue stool regimen. CT abdomen shows no other acute abnormality. Abdominal pain has improved.   HTN. Currently on amlodipine and losartan which I will continue.   Anxiety. Continue Xanax and Paxil.   HLD. Continue statin.  Back pain. Most likely associated with her being in the bed. CT abdomen negative for any acute abnormality.  No focal deficit.  Subjective: Feeling better.  No nausea no vomiting.  Had a fever again last evening.  No chest pain.  Physical Exam: Vitals:   06/11/22 0832 06/11/22 1147 06/11/22 1157 06/11/22 1526  BP: (!) 153/79  (!) 147/80 139/73  Pulse: 99  99 84  Resp: 20  (!) 21 18  Temp: 98.3 F (36.8 C)  98.4 F (36.9 C) 99.4 F (37.4 C)  TempSrc: Oral  Oral   SpO2: 99% 95% 95% 93%  Weight:       Height:       General: Appear in mild distress, no Rash; Oral Mucosa clear, moist. Cardiovascular: S1 and S2 Present, no Murmur, Respiratory: Increased respiratory effort, Bilateral Air entry present and bilateral Crackles present , no wheezes Abdomen: Bowel Sound present, Soft and no tenderness Extremities: trace Pedal edema Neurology: alert and oriented to Self, place and time affect appropriate. no new focal deficit  Data Reviewed: I have Reviewed nursing notes, Vitals, and Lab results since pt's last encounter. Pertinent lab results CBC and BMP I have ordered test including BMP and procalcitonin     Family Communication: Husband at bedside.  Disposition: Status is: Inpatient Remains inpatient appropriate because: Monitor for being afebrile for 24 hours prior to discharge.  Author: Berle Mull, MD 06/11/2022 6:27 PM  Please look on [email or web address] to find out who is on call.

## 2022-06-12 ENCOUNTER — Encounter: Payer: Self-pay | Admitting: Internal Medicine

## 2022-06-12 ENCOUNTER — Other Ambulatory Visit: Payer: Self-pay | Admitting: Internal Medicine

## 2022-06-12 DIAGNOSIS — J189 Pneumonia, unspecified organism: Secondary | ICD-10-CM | POA: Diagnosis not present

## 2022-06-12 LAB — BASIC METABOLIC PANEL
Anion gap: 10 (ref 5–15)
BUN: 7 mg/dL (ref 6–20)
CO2: 26 mmol/L (ref 22–32)
Calcium: 9.6 mg/dL (ref 8.9–10.3)
Chloride: 100 mmol/L (ref 98–111)
Creatinine, Ser: 0.75 mg/dL (ref 0.44–1.00)
GFR, Estimated: >60 mL/min (ref >60)
Glucose, Bld: 125 mg/dL — ABNORMAL HIGH (ref 70–99)
Potassium: 3.9 mmol/L (ref 3.5–5.1)
Sodium: 136 mmol/L (ref 135–145)

## 2022-06-12 LAB — CBC WITH DIFFERENTIAL/PLATELET
Abs Immature Granulocytes: 0.02 10*3/uL (ref 0.00–0.07)
Basophils Absolute: 0 10*3/uL (ref 0.0–0.1)
Basophils Relative: 1 %
Eosinophils Absolute: 0 10*3/uL (ref 0.0–0.5)
Eosinophils Relative: 1 %
HCT: 35.8 % — ABNORMAL LOW (ref 36.0–46.0)
Hemoglobin: 12.2 g/dL (ref 12.0–15.0)
Immature Granulocytes: 1 %
Lymphocytes Relative: 13 %
Lymphs Abs: 0.5 10*3/uL — ABNORMAL LOW (ref 0.7–4.0)
MCH: 31.4 pg (ref 26.0–34.0)
MCHC: 34.1 g/dL (ref 30.0–36.0)
MCV: 92 fL (ref 80.0–100.0)
Monocytes Absolute: 0.7 10*3/uL (ref 0.1–1.0)
Monocytes Relative: 19 %
Neutro Abs: 2.6 10*3/uL (ref 1.7–7.7)
Neutrophils Relative %: 65 %
Platelets: 188 10*3/uL (ref 150–400)
RBC: 3.89 MIL/uL (ref 3.87–5.11)
RDW: 12.2 % (ref 11.5–15.5)
WBC: 3.9 10*3/uL — ABNORMAL LOW (ref 4.0–10.5)
nRBC: 0 % (ref 0.0–0.2)

## 2022-06-12 LAB — MAGNESIUM: Magnesium: 1.8 mg/dL (ref 1.7–2.4)

## 2022-06-12 MED ORDER — AZITHROMYCIN 500 MG PO TABS: 500.0000 mg | ORAL_TABLET | Freq: Every day | ORAL | 0 refills | Status: DC

## 2022-06-12 MED ORDER — SODIUM CHLORIDE 0.9 % IV SOLN
2.0000 g | Freq: Once | INTRAVENOUS | Status: DC
  Filled 2022-06-12 (×2): qty 20

## 2022-06-12 MED ORDER — CEFDINIR 300 MG PO CAPS: 300.0000 mg | ORAL_CAPSULE | Freq: Two times a day (BID) | ORAL | 0 refills | Status: DC

## 2022-06-12 MED ORDER — AZITHROMYCIN 500 MG PO TABS: 500.0000 mg | ORAL_TABLET | Freq: Every day | ORAL | 0 refills | Status: AC

## 2022-06-12 MED ORDER — CEFDINIR 300 MG PO CAPS
300.0000 mg | ORAL_CAPSULE | Freq: Once | ORAL | Status: AC
  Administered 2022-06-12: 300 mg via ORAL
  Filled 2022-06-12: qty 1

## 2022-06-12 MED ORDER — CEFDINIR 300 MG PO CAPS: 300.0000 mg | ORAL_CAPSULE | Freq: Two times a day (BID) | ORAL | 0 refills | Status: AC

## 2022-06-12 NOTE — Plan of Care (Signed)
  Problem: Education: Goal: Knowledge of General Education information will improve Description: Including pain rating scale, medication(s)/side effects and non-pharmacologic comfort measures 06/12/2022 0923 by Orvan Seen, RN Outcome: Progressing 06/12/2022 0847 by Orvan Seen, RN Outcome: Progressing   Problem: Health Behavior/Discharge Planning: Goal: Ability to manage health-related needs will improve 06/12/2022 0923 by Orvan Seen, RN Outcome: Progressing 06/12/2022 0847 by Orvan Seen, RN Outcome: Progressing   Problem: Clinical Measurements: Goal: Ability to maintain clinical measurements within normal limits will improve 06/12/2022 0923 by Orvan Seen, RN Outcome: Progressing 06/12/2022 0847 by Orvan Seen, RN Outcome: Progressing Goal: Will remain free from infection 06/12/2022 0923 by Orvan Seen, RN Outcome: Progressing 06/12/2022 0847 by Orvan Seen, RN Outcome: Progressing Goal: Diagnostic test results will improve 06/12/2022 0923 by Orvan Seen, RN Outcome: Progressing 06/12/2022 0847 by Orvan Seen, RN Outcome: Progressing Goal: Respiratory complications will improve 06/12/2022 0923 by Orvan Seen, RN Outcome: Progressing 06/12/2022 0847 by Orvan Seen, RN Outcome: Progressing Goal: Cardiovascular complication will be avoided 06/12/2022 0923 by Orvan Seen, RN Outcome: Progressing 06/12/2022 0847 by Orvan Seen, RN Outcome: Progressing   Problem: Activity: Goal: Risk for activity intolerance will decrease 06/12/2022 0923 by Orvan Seen, RN Outcome: Progressing 06/12/2022 0847 by Orvan Seen, RN Outcome: Progressing   Problem: Nutrition: Goal: Adequate nutrition will be maintained 06/12/2022 0923 by Orvan Seen, RN Outcome: Progressing 06/12/2022 0847 by Orvan Seen, RN Outcome: Progressing   Problem: Coping: Goal: Level of anxiety will decrease 06/12/2022 0923 by Orvan Seen, RN Outcome: Progressing 06/12/2022 0847 by Orvan Seen, RN Outcome: Progressing   Problem: Elimination: Goal: Will not experience complications related to bowel motility 06/12/2022 0923 by Orvan Seen, RN Outcome: Progressing 06/12/2022 0847 by Orvan Seen, RN Outcome: Progressing Goal: Will not experience complications related to urinary retention 06/12/2022 0923 by Orvan Seen, RN Outcome: Progressing 06/12/2022 0847 by Orvan Seen, RN Outcome: Progressing   Problem: Pain Managment: Goal: General experience of comfort will improve 06/12/2022 0923 by Orvan Seen, RN Outcome: Progressing 06/12/2022 0847 by Orvan Seen, RN Outcome: Progressing   Problem: Safety: Goal: Ability to remain free from injury will improve 06/12/2022 0923 by Orvan Seen, RN Outcome: Progressing 06/12/2022 0847 by Orvan Seen, RN Outcome: Progressing   Problem: Skin Integrity: Goal: Risk for impaired skin integrity will decrease 06/12/2022 0923 by Orvan Seen, RN Outcome: Progressing 06/12/2022 0847 by Orvan Seen, RN Outcome: Progressing

## 2022-06-12 NOTE — Progress Notes (Signed)
Patient discharged to home, wheeled out of unit by support team with all belongings and accompanied by husband. Medications and discharge instructions reviewed. All questions answered. PIV x1 removed, no bleeding intact. Patient agreed to follow up with here PCP. Patient satisfied with overall care at [facility].

## 2022-06-12 NOTE — Plan of Care (Signed)

## 2022-06-14 ENCOUNTER — Other Ambulatory Visit: Payer: Self-pay | Admitting: Family Medicine

## 2022-06-14 ENCOUNTER — Other Ambulatory Visit: Payer: Self-pay | Admitting: Obstetrics and Gynecology

## 2022-06-14 DIAGNOSIS — F419 Anxiety disorder, unspecified: Secondary | ICD-10-CM

## 2022-06-14 MED ORDER — NAPROXEN 500 MG PO TABS: 500.0000 mg | ORAL_TABLET | Freq: Two times a day (BID) | ORAL | 0 refills | Status: DC

## 2022-06-14 MED ORDER — ALPRAZOLAM 0.5 MG PO TABS: 0.5000 mg | ORAL_TABLET | Freq: Two times a day (BID) | ORAL | 0 refills | Status: DC | PRN

## 2022-06-15 ENCOUNTER — Telehealth: Payer: Self-pay

## 2022-06-15 LAB — CULTURE, BLOOD (ROUTINE X 2)
Culture: NO GROWTH
Culture: NO GROWTH
Special Requests: ADEQUATE
Special Requests: ADEQUATE

## 2022-06-15 NOTE — Telephone Encounter (Signed)
Transition Care Management Unsuccessful Follow-up Telephone Call  Date of discharge and from where:  [facility]  Attempts:  1st Attempt  Reason for unsuccessful TCM follow-up call:  Left voice message

## 2022-06-15 NOTE — Discharge Summary (Signed)
Physician Discharge Summary   Patient: Mary Christian MRN: [id number] DOB: February 22, 1961  Admit date:     06/08/2022  Discharge date: 06/12/2022  Discharge Physician: Berle Mull  PCP: Jerrol Banana., MD  Recommendations at discharge: Follow-up with PCP in 1 week.   Follow-up Information     Jerrol Banana., MD .   Specialty: Family Medicine Contact information: [address] [place] [address] [phone]                Discharge Diagnoses: Principal Problem:   Community acquired pneumonia Active Problems:   Abdominal pain   Acute respiratory failure with hypoxia ([place])   Essential hypertension   Anxiety   Dyslipidemia   GERD without esophagitis  Hospital Course: PMH of anxiety, GERD, HTN, hiatal hernia present to the hospital with complaints of abdominal pain.  Found to have constipation causing abdominal pain but also has sepsis likely from community-acquired pneumonia. Assessment and Plan  Acute hypoxic respiratory failure. Community-acquired pneumonia. Sepsis present on admission. Mildly hypoxic at the time of admission. Chest x-ray shows atelectasis. Patient had fever tachycardia and tachypnea meeting SIRS criteria. Blood cultures not performed on admission. Continue with antibiotics. Pro-Cal elevated.  CRP elevated.  ESR elevated.  COVID-negative.  Respiratory virus pathogen panel negative. CT PE protocol negative for pulmonary embolism but does show evidence of bilateral basal.  Atelectasis/pneumonia. Continue Omnicef and azithromycin. After discharge home patient request to her pharmacy to be modified.  Prescription was sent to the new pharmacy per patient request.   Severe constipation. Abdominal pain. Nausea. GERD. Continue stool regimen.  Constipation resolved. CT abdomen shows no other acute abnormality. Abdominal pain has improved.   HTN. Currently on amlodipine and losartan which I will continue.    Anxiety. Continue Xanax and Paxil.   HLD. Continue statin.   Back pain. Most likely associated with her being in the bed. CT abdomen negative for any acute abnormality.  No focal deficit.  Consultants:  none  Procedures performed:  noen  DISCHARGE MEDICATION: Allergies as of 06/12/2022   No Known Allergies      Medication List     STOP taking these medications    traMADol 50 MG tablet Commonly known as: ULTRAM       TAKE these medications    amLODipine 10 MG tablet Commonly known as: NORVASC TAKE ONE TABLET BY MOUTH EVERY DAY   CALCIUM CITRATE-VITAMIN D PO Take 630 mg by mouth daily.   doxazosin 2 MG tablet Commonly known as: CARDURA TAKE 1 TABLET BY MOUTH DAILY   ezetimibe 10 MG tablet Commonly known as: ZETIA Take 1 tablet (10 mg total) by mouth daily.   losartan 100 MG tablet Commonly known as: COZAAR TAKE ONE TABLET EVERY DAY   omeprazole 20 MG capsule Commonly known as: PRILOSEC Take 20 mg by mouth daily.   ondansetron 4 MG tablet Commonly known as: Zofran Take 1 tablet (4 mg total) by mouth every 8 (eight) hours as needed for nausea or vomiting.   PARoxetine 20 MG tablet Commonly known as: PAXIL Take 1 tablet (20 mg total) by mouth daily.   polyethylene glycol 17 g packet Commonly known as: MiraLax Take 17 g by mouth daily.   rosuvastatin 20 MG tablet Commonly known as: CRESTOR TAKE ONE TABLET BY MOUTH EVERY DAY       ASK your doctor about these medications    magnesium citrate Soln Take 296 mLs (1 Bottle total) by mouth once  for 1 dose. Ask about: Should I take this medication?       Disposition: Home Diet recommendation: Cardiac diet  Discharge Exam: Vitals:   06/11/22 1901 06/12/22 0043 06/12/22 0502 06/12/22 0730  BP: (!) 146/74  137/72 (!) 140/69  Pulse: 93  88 91  Resp: _0 Temp: 100.2 F (37.9 C)  100 F (37.8 C) 98.7 F (37.1 C)  TempSrc:   Oral Oral  SpO2: 93%  95% 98%  Weight:      Height:        General: Appear in  distress; no visible Abnormal Neck Mass Or lumps, Conjunctiva normal Cardiovascular: S1 and S2 Present, no Murmur, Respiratory: good respiratory effort, Bilateral Air entry present and CTA, no Crackles, no wheezes Abdomen: Bowel Sound present, Non tender  Extremities: no Pedal edema Neurology: alert and oriented to time, place, and person  Filed Weights   06/08/22 0930  Weight: 80 kg   Condition at discharge: stable  The results of significant diagnostics from this hospitalization (including imaging, microbiology, ancillary and laboratory) are listed below for reference.   Imaging Studies: DG Chest Port 1 View  Result Date: 06/10/2022 CLINICAL DATA:  [id number].  Sepsis. EXAM: PORTABLE CHEST 1 VIEW COMPARISON:  Chest CT yesterday at 8:52 a.m. PA Lat chest 06/08/2022. FINDINGS: 12:41 a.m. The lungs are expiratory. A left perihilar atelectatic band is again noted, with minimal linear atelectasis along the right hilum. Yesterday's CT demonstrated patchy airspace disease in the lower lobes, which is probably still present but hidden behind the hemidiaphragms on this expiratory exam. No new infiltrate is seen. The upper lung fields are clear. Mild cardiomegaly is seen without CHF. IMPRESSION: Expiratory study. While there is no visible infiltrate, the patchy airspace disease in the lower lobes noted on yesterday's CT is probably still present but hidden behind the domes of the diaphragm. No new infiltrate is suspected.  Cardiomegaly. Electronically Signed   By: Telford Nab M.D.   On: 06/10/2022 00:57   CT Angio Chest Pulmonary Embolism (PE) W or WO Contrast  Result Date: 06/09/2022 CLINICAL DATA:  Suspected pulmonary embolism. EXAM: CT ANGIOGRAPHY CHEST WITH CONTRAST TECHNIQUE: Multidetector CT imaging of the chest was performed using the standard protocol during bolus administration of intravenous contrast. Multiplanar CT image reconstructions and MIPs were obtained to  evaluate the vascular anatomy. RADIATION DOSE REDUCTION: This exam was performed according to the departmental dose-optimization program which includes automated exposure control, adjustment of the mA and/or kV according to patient size and/or use of iterative reconstruction technique. CONTRAST:  84m OMNIPAQUE IOHEXOL 350 MG/ML SOLN COMPARISON:  Calcium scoring evaluation from Jan 20, 2022 FINDINGS: Cardiovascular: Normal caliber of the thoracic aorta. The top-normal heart size with trace pericardial effusion. Aorta with normal three-vessel branching pattern. No acute aortic process. Aortic atherosclerosis both calcified and noncalcified is mild. Calcified coronary artery disease of LEFT coronary circulation. Central pulmonary vasculature is opacified measuring 3 in 22 Hounsfield units, study quality is adequate. Negative for pulmonary embolism. Mediastinum/Nodes: No thoracic inlet, axillary, mediastinal or hilar adenopathy. Esophagus grossly normal. Small hiatal hernia. Lungs/Pleura: No pneumothorax. Patchy basilar airspace disease some of which represents atelectasis though with variable enhancement in some areas raising the question of infection. Airways are patent. Trace RIGHT pleural effusion. 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 See dedicated abdominal CT for further detail regarding upper abdominal structures. Sludge in the gallbladder. Musculoskeletal: No acute bone finding. No destructive bone process. Spinal degenerative changes. Review of the  MIP images confirms the above findings. IMPRESSION: 1. Negative for pulmonary embolism. 2. Patchy basilar airspace disease some of which represents atelectasis though with variable enhancement in some areas raising the question of infection. 3. Trace RIGHT pleural effusion. 4. Calcified coronary artery disease of LEFT coronary circulation. 5. 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 See dedicated abdominal CT for further detail regarding upper abdominal structures.  Aortic atherosclerosis. Aortic Atherosclerosis (ICD10-I70.0). Electronically Signed   By: Zetta Bills M.D.   On: 06/09/2022 09:15   DG Chest 2 View  Result Date: 06/08/2022 CLINICAL DATA:  Back pain, gallbladder sludge, nausea, shortness of breath EXAM: CHEST - 2 VIEW COMPARISON:  03/10/2012, 06/08/2022 FINDINGS: Frontal and lateral views of the chest demonstrate an unremarkable cardiac silhouette. Areas of bibasilar atelectasis are again noted. No airspace disease, effusion, or pneumothorax. Lung volumes are diminished. No acute bony abnormalities. IMPRESSION: 1. Low lung volumes and bibasilar atelectasis. No acute airspace disease. Electronically Signed   By: Randa Ngo M.D.   On: 06/08/2022 17:26   CT ABDOMEN PELVIS W CONTRAST  Result Date: 06/08/2022 CLINICAL DATA:  Right upper quadrant abdominal pain. EXAM: CT ABDOMEN AND PELVIS WITH CONTRAST TECHNIQUE: Multidetector CT imaging of the abdomen and pelvis was performed using the standard protocol following bolus administration of intravenous contrast. RADIATION DOSE REDUCTION: This exam was performed according to the departmental dose-optimization program which includes automated exposure control, adjustment of the mA and/or kV according to patient size and/or use of iterative reconstruction technique. CONTRAST:  128m OMNIPAQUE IOHEXOL 300 MG/ML  SOLN COMPARISON:  June 07, 2022. FINDINGS: Lower chest: Mild bilateral posterior basilar atelectasis or infiltrates are noted. Hepatobiliary: No focal liver abnormality is seen. No gallstones, gallbladder wall thickening, or biliary dilatation. Pancreas: Unremarkable. No pancreatic ductal dilatation or surrounding inflammatory changes. Spleen: Probable 1 cm calcified splenic artery aneurysm is noted. The spleen itself is unremarkable. Adrenals/Urinary Tract: Adrenal glands appear normal. Severe left renal cortical atrophy is noted. Nonobstructive left renal calculus is noted. No hydronephrosis or  renal obstruction is noted. Urinary bladder is decompressed. Stomach/Bowel: The stomach and appendix are unremarkable. There is no evidence of bowel obstruction. Large amount of stool is seen in the cecum and right colon. Vascular/Lymphatic: Aortic atherosclerosis. No enlarged abdominal or pelvic lymph nodes. Reproductive: Uterus and bilateral adnexa are unremarkable. Other: No abdominal wall hernia or abnormality. No abdominopelvic ascites. Musculoskeletal: No acute or significant osseous findings. IMPRESSION: Mild bilateral posterior basilar atelectasis or infiltrates are noted. Severe left renal cortical atrophy is noted. Nonobstructive left renal calculus is noted. No hydronephrosis or renal obstruction is noted. Large amount of stool is seen in the cecum and right colon. There is no definite evidence of bowel obstruction or inflammation. 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 Aortic Atherosclerosis (ICD10-I70.0). Electronically Signed   By: JMarijo ConceptionM.D.   On: 06/08/2022 12:07   UKoreaAbdomen Limited RUQ (LIVER/GB)  Result Date: 06/07/2022 CLINICAL DATA:  Right upper quadrant abdominal pain over the last 5 days. EXAM: ULTRASOUND ABDOMEN LIMITED RIGHT UPPER QUADRANT COMPARISON:  None FINDINGS: Gallbladder: No Murphy sign. No wall thickening or surrounding fluid. Very small amount of gallbladder sludge. No shadowing stone. Common bile duct: Diameter: 3 mm, normal. Liver: Echogenic liver suggesting fatty change. No focal lesion or ductal dilatation. Portal vein is patent on color Doppler imaging with normal direction of blood flow towards the liver. Other: None. IMPRESSION: Increased echogenicity of the liver suggesting steatosis. No focal lesion or ductal  dilatation. No gallstones or sonographic Murphy sign. Very tiny amount of sludge in the gallbladder. Electronically Signed   By: Nelson Chimes M.D.   On: 06/07/2022 09:29    Microbiology: Results for orders placed or  performed during the hospital encounter of 06/08/22  SARS Coronavirus 2 by RT PCR (hospital order, performed in [facility] hospital lab) *cepheid single result test* Anterior Nasal Swab     Status: None   Collection Time: 06/08/22  4:56 PM   Specimen: Anterior Nasal Swab  Result Value Ref Range Status   SARS Coronavirus 2 by RT PCR NEGATIVE NEGATIVE Final    Comment: (NOTE) SARS-CoV-2 target nucleic acids are NOT DETECTED.  The SARS-CoV-2 RNA is generally detectable in upper and lower respiratory specimens during the acute phase of infection. The lowest concentration of SARS-CoV-2 viral copies this assay can detect is 250 copies / mL. A negative result does not preclude SARS-CoV-2 infection and should not be used as the sole basis for treatment or other patient management decisions.  A negative result may occur with improper specimen collection / handling, submission of specimen other than nasopharyngeal swab, presence of viral mutation(s) within the areas targeted by this assay, and inadequate number of viral copies (<250 copies / mL). A negative result must be combined with clinical observations, patient history, and epidemiological information.  Fact Sheet for Patients:   https://www..info/  Fact Sheet for Healthcare Providers: https://hall.com/  This test is not yet approved or  cleared by the [place] FDA and has been authorized for detection and/or diagnosis of SARS-CoV-2 by FDA under an Emergency Use Authorization (EUA).  This EUA will remain in effect (meaning this test can be used) for the duration of the COVID-19 declaration under Section 564(b)(1) of the Act, 21 U.S.C. section 360bbb-3(b)(1), unless the authorization is terminated or revoked sooner.  Performed at [facility], [address] [place], [place] [address]   Culture, blood (x 2)     Status: None   Collection Time: 06/10/22  1:27 AM    Specimen: BLOOD  Result Value Ref Range Status   Specimen Description BLOOD RIGHT ANTECUBITAL  Final   Special Requests   Final    BOTTLES DRAWN AEROBIC AND ANAEROBIC Blood Culture adequate volume   Culture   Final    NO GROWTH 5 DAYS Performed at [facility], [address] [place], [place] [address]    Report Status 06/15/2022 FINAL  Final  Culture, blood (x 2)     Status: None   Collection Time: 06/10/22  1:31 AM   Specimen: BLOOD  Result Value Ref Range Status   Specimen Description BLOOD BLOOD RIGHT HAND  Final   Special Requests   Final    BOTTLES DRAWN AEROBIC AND ANAEROBIC Blood Culture adequate volume   Culture   Final    NO GROWTH 5 DAYS Performed at [facility], [address] [place], [place] [address]    Report Status 06/15/2022 FINAL  Final  Respiratory (~20 pathogens) panel by PCR     Status: None   Collection Time: 06/10/22  9:00 AM   Specimen: Nasopharyngeal Swab; Respiratory  Result Value Ref Range Status   Adenovirus NOT DETECTED NOT DETECTED Final   Coronavirus 229E NOT DETECTED NOT DETECTED Final    Comment: (NOTE) The Coronavirus on the Respiratory Panel, DOES NOT test for the novel  Coronavirus (2019 nCoV)    Coronavirus HKU1 NOT DETECTED NOT DETECTED Final   Coronavirus NL63 NOT  DETECTED NOT DETECTED Final   Coronavirus OC43 NOT DETECTED NOT DETECTED Final   Metapneumovirus NOT DETECTED NOT DETECTED Final   Rhinovirus / Enterovirus NOT DETECTED NOT DETECTED Final   Influenza A NOT DETECTED NOT DETECTED Final   Influenza B NOT DETECTED NOT DETECTED Final   Parainfluenza Virus 1 NOT DETECTED NOT DETECTED Final   Parainfluenza Virus 2 NOT DETECTED NOT DETECTED Final   Parainfluenza Virus 3 NOT DETECTED NOT DETECTED Final   Parainfluenza Virus 4 NOT DETECTED NOT DETECTED Final   Respiratory Syncytial Virus NOT DETECTED NOT DETECTED Final   Bordetella pertussis NOT DETECTED NOT DETECTED Final   Bordetella Parapertussis NOT  DETECTED NOT DETECTED Final   Chlamydophila pneumoniae NOT DETECTED NOT DETECTED Final   Mycoplasma pneumoniae NOT DETECTED NOT DETECTED Final    Comment: Performed at [facility], [place] [address], [place], [place] [address]   Labs: CBC: Recent Labs  Lab 06/10/22 0128 06/10/22 0407 06/11/22 0553 06/12/22 0535  WBC 4.2 4.0 3.8* 3.9*  NEUTROABS 3.4 3.2 2.4 2.6  HGB 11.3* 12.2 11.8* 12.2  HCT 33.6* 35.8* 34.6* 35.8*  MCV 94.1 95.2 91.3 92.0  PLT 178 174 196 378   Basic Metabolic Panel: Recent Labs  Lab 06/09/22 1556 06/10/22 0128 06/10/22 0407 06/11/22 0553 06/12/22 0535  NA 132* 135 136 136 136  K 3.8 3.3* 3.9 3.0* 3.9  CL 99 101 103 102 100  CO2 _0 GLUCOSE 144* 133* 127* 107* 125*  BUN _1 CREATININE 0.95 0.92 0.89 0.80 0.75  CALCIUM 9.1 8.9 9.0 9.0 9.6  MG 1.7  --  1.9 1.8 1.8   Liver Function Tests: Recent Labs  Lab 06/10/22 0128  AST 47*  ALT 32  ALKPHOS 54  BILITOT 0.8  PROT 6.7  ALBUMIN 3.0*   CBG: No results for input(s): "GLUCAP" in the last 168 hours.  Discharge time spent: greater than 30 minutes.  Signed: Berle Mull, MD [facility] 06/12/2022

## 2022-06-18 ENCOUNTER — Other Ambulatory Visit: Payer: Self-pay | Admitting: Cardiovascular Disease

## 2022-07-09 ENCOUNTER — Inpatient Hospital Stay: Payer: 59 | Admitting: Family Medicine

## 2022-08-05 ENCOUNTER — Other Ambulatory Visit: Payer: Self-pay | Admitting: Cardiovascular Disease

## 2022-08-30 ENCOUNTER — Encounter: Payer: Self-pay | Admitting: Cardiovascular Disease

## 2022-09-07 ENCOUNTER — Other Ambulatory Visit: Payer: Self-pay | Admitting: Obstetrics and Gynecology

## 2022-09-07 DIAGNOSIS — F419 Anxiety disorder, unspecified: Secondary | ICD-10-CM

## 2022-09-07 MED ORDER — ALPRAZOLAM 0.5 MG PO TABS: 0.5000 mg | ORAL_TABLET | Freq: Two times a day (BID) | ORAL | 0 refills | Status: DC | PRN

## 2022-09-14 ENCOUNTER — Other Ambulatory Visit: Payer: Self-pay | Admitting: Cardiovascular Disease

## 2022-09-28 ENCOUNTER — Encounter: Payer: Self-pay | Admitting: Family Medicine

## 2022-09-28 NOTE — Telephone Encounter (Signed)
Pls see new insurance info per pt.

## 2022-09-29 ENCOUNTER — Encounter: Payer: Self-pay | Admitting: Obstetrics and Gynecology

## 2022-10-06 NOTE — Progress Notes (Signed)
[name], [name], MD   Chief Complaint  Patient presents with   Breast Exam    Pain on sides of both breasts x 1 week    HPI:      [name] is a 62 y.o. [phone] whose LMP was Patient's last menstrual period was 06/16/2016., presents today for bilat breast pain. Started on RT outer quadrant a few wks ago with possible mass (but pt unsure) and sx have improved. Hx of stable RT breast calcifications. Then developed pain in LT outer quadrant a wk ago. Taking ibup with a little improvement, using a heating pad. Pt plays golf regularly and has been doing it recently. LT arm hurts to pick up things, LT lateral rib area tender to touch. No masses. No erythema/nipple d/c/trauma bilat. No scratches/cuts on arms. Not on HRT. Neg mammo 12/09/21   Patient Active Problem List   Diagnosis Date Noted   Abdominal pain 06/08/2022   Acute respiratory failure with hypoxia ([place]) 06/08/2022   Dyslipidemia 06/08/2022   GERD without esophagitis 06/08/2022   Community acquired pneumonia    RUQ pain 06/04/2022   Bacteriuria 06/04/2022   Vasomotor symptoms due to menopause 10/20/2020   Breast calcification seen on mammogram 10/17/2019   Polyp of ascending colon    Family history of colon cancer 09/25/2018   Special screening for malignant neoplasms, colon 04/04/2018   Fibroid of cervix 03/05/2016   Menorrhagia with irregular cycle 03/05/2016   Insomnia 01/30/2016   Essential hypertension 05/01/2014   Leg swelling 05/01/2014   Anxiety 05/01/2014   Benign essential HTN 02/24/2006    Past Surgical History:  Procedure Laterality Date   CESAREAN SECTION     COLONOSCOPY  08/04/2006   Dr Bary Castilla   COLONOSCOPY WITH PROPOFOL N/A 06/15/2019   Procedure: COLONOSCOPY WITH Biopsy;  Surgeon: Lucilla Lame, MD;  Location: [facility];  Service: Endoscopy;  Laterality: N/A;   DILATATION & CURETTAGE/HYSTEROSCOPY WITH MYOSURE N/A 03/05/2016   Procedure: DILATATION & CURETTAGE/HYSTEROSCOPY  WITH MYOSURE;  Surgeon: Will Bonnet, MD;  Location: [facility];  Service: Gynecology;  Laterality: N/A;   DILATION AND CURETTAGE OF UTERUS     POLYPECTOMY N/A 06/15/2019   Procedure: POLYPECTOMY;  Surgeon: Lucilla Lame, MD;  Location: [facility];  Service: Endoscopy;  Laterality: N/A;  Clip x1  placed as site of Ascending Colon Polyp Removal   TUBAL LIGATION     UPPER GI ENDOSCOPY  08/04/2006   Dr Bary Castilla    Family History  Problem Relation Age of Onset   Hypertension Father    Heart disease Father        CABG x 3   Heart attack Father 92   Colon cancer Father    Liver cancer Father    Hypertension Sister    Hyperlipidemia Sister    Skin cancer Sister        82s   Hypertension Brother    Hypertension Sister    Hyperlipidemia Sister    Hypertension Mother    Colon cancer Mother 72       with mets.   Heart Problems Maternal Grandfather    Colon cancer Paternal Grandfather    Ovarian cancer Paternal Aunt 36   Breast cancer Neg Hx     Social History   Socioeconomic History   Marital status: Married    Spouse name: Not on file   Number of children: Not on file   Years of education: Not on file   Highest  education level: Not on file  Occupational History   Not on file  Tobacco Use   Smoking status: Former    Packs/day: 1.00    Years: 3.00    Total pack years: 3.00    Types: Cigarettes    Quit date: 05/01/1985    Years since quitting: 37.4   Smokeless tobacco: Never  Vaping Use   Vaping Use: Never used  Substance and Sexual Activity   Alcohol use: Yes    Alcohol/week: 14.0 standard drinks of alcohol    Types: 14 Glasses of wine per week   Drug use: No   Sexual activity: Yes    Birth control/protection: Post-menopausal  Other Topics Concern   Not on file  Social History Narrative   Not on file   Social Determinants of Health   Financial Resource Strain: Not on file  Food Insecurity: Not on file  Transportation Needs: Not on file  Physical  Activity: Not on file  Stress: Not on file  Social Connections: Not on file  Intimate Partner Violence: Not on file    Outpatient Medications Prior to Visit  Medication Sig Dispense Refill   amLODipine (NORVASC) 10 MG tablet TAKE ONE TABLET BY MOUTH EVERY DAY 90 tablet 0   CALCIUM CITRATE-VITAMIN D PO Take 630 mg by mouth daily.     ezetimibe (ZETIA) 10 MG tablet TAKE 1 TABLET BY MOUTH DAILY 90 tablet 0   losartan (COZAAR) 100 MG tablet TAKE 1 TABLET BY MOUTH DAILY 90 tablet 0   PARoxetine (PAXIL) 20 MG tablet Take 1 tablet (20 mg total) by mouth daily. 90 tablet 3   rosuvastatin (CRESTOR) 20 MG tablet TAKE ONE TABLET BY MOUTH EVERY DAY 90 tablet 4   ALPRAZolam (XANAX) 0.5 MG tablet Take 1 tablet (0.5 mg total) by mouth 2 (two) times daily as needed for anxiety. 30 tablet 0   doxazosin (CARDURA) 2 MG tablet Take 1 tablet (2 mg total) by mouth daily. Schedule office visit for future refills. 3rd attrempt 15 tablet 0   naproxen (NAPROSYN) 500 MG tablet Take 1 tablet (500 mg total) by mouth 2 (two) times daily with a meal. Ass needed for pain. 30 tablet 0   omeprazole (PRILOSEC) 20 MG capsule Take 20 mg by mouth daily.     ondansetron (ZOFRAN) 4 MG tablet Take 1 tablet (4 mg total) by mouth every 8 (eight) hours as needed for nausea or vomiting. 20 tablet 0   polyethylene glycol (MIRALAX) 17 g packet Take 17 g by mouth daily. 14 each 0   No facility-administered medications prior to visit.      ROS:  Review of Systems  Constitutional:  Negative for fever.  Gastrointestinal:  Negative for blood in stool, constipation, diarrhea, nausea and vomiting.  Genitourinary:  Negative for dyspareunia, dysuria, flank pain, frequency, hematuria, urgency, vaginal bleeding, vaginal discharge and vaginal pain.  Musculoskeletal:  Negative for back pain.  Skin:  Negative for rash.   BREAST: tenderness   OBJECTIVE:   Vitals:  BP 110/70   Ht '5\' 5"'$  (1.651 m)   Wt 164 lb (74.4 kg)   LMP 06/16/2016    BMI 27.29 kg/m   Physical Exam Vitals reviewed.  Pulmonary:     Effort: Pulmonary effort is normal.  Chest:  Breasts:    Breasts are symmetrical.     Right: No inverted nipple, mass, nipple discharge, skin change or tenderness.     Left: Tenderness present. No inverted nipple, mass, nipple discharge or  skin change.    Musculoskeletal:        General: Normal range of motion.     Cervical back: Normal range of motion.  Skin:    General: Skin is warm and dry.  Neurological:     General: No focal deficit present.     Mental Status: She is alert and oriented to person, place, and time.     Cranial Nerves: No cranial nerve deficit.  Psychiatric:        Mood and Affect: Mood normal.        Behavior: Behavior normal.        Thought Content: Thought content normal.        Judgment: Judgment normal.      Assessment/Plan: Breast tenderness--LT side for a week. Tender MSK on exam, but neg breast exam otherwise. No masses. Most likely MSK due to sx hx and location. Cont NSAIDs/heating pad. F/u next wk if sx persist for imaging. Neg RT breast exam, pain resolved.     Return if symptoms worsen or fail to improve.  Kalob Bergen B. Ezekeil Bethel, PA-C 10/07/2022 1:21 PM

## 2022-10-07 ENCOUNTER — Encounter: Payer: Self-pay | Admitting: Obstetrics and Gynecology

## 2022-10-07 ENCOUNTER — Ambulatory Visit (INDEPENDENT_AMBULATORY_CARE_PROVIDER_SITE_OTHER): Payer: 59 | Admitting: Obstetrics and Gynecology

## 2022-10-07 VITALS — BP 110/70 | Ht 65.0 in | Wt 164.0 lb

## 2022-10-07 DIAGNOSIS — N644 Mastodynia: Secondary | ICD-10-CM

## 2022-10-09 ENCOUNTER — Emergency Department: Payer: 59

## 2022-10-09 ENCOUNTER — Emergency Department
Admission: EM | Admit: 2022-10-09 | Discharge: 2022-10-09 | Disposition: A | Discharge status: Home / Self Care | Payer: 59 | Attending: Emergency Medicine | Admitting: Emergency Medicine

## 2022-10-09 ENCOUNTER — Other Ambulatory Visit: Payer: Self-pay

## 2022-10-09 DIAGNOSIS — I959 Hypotension, unspecified: Secondary | ICD-10-CM | POA: Diagnosis not present

## 2022-10-09 DIAGNOSIS — S36039A Unspecified laceration of spleen, initial encounter: Secondary | ICD-10-CM

## 2022-10-09 DIAGNOSIS — J9811 Atelectasis: Secondary | ICD-10-CM | POA: Diagnosis not present

## 2022-10-09 DIAGNOSIS — Z20822 Contact with and (suspected) exposure to covid-19: Secondary | ICD-10-CM | POA: Insufficient documentation

## 2022-10-09 DIAGNOSIS — M79602 Pain in left arm: Secondary | ICD-10-CM | POA: Insufficient documentation

## 2022-10-09 DIAGNOSIS — S42002A Fracture of unspecified part of left clavicle, initial encounter for closed fracture: Secondary | ICD-10-CM | POA: Diagnosis not present

## 2022-10-09 DIAGNOSIS — R0902 Hypoxemia: Secondary | ICD-10-CM | POA: Diagnosis not present

## 2022-10-09 DIAGNOSIS — I10 Essential (primary) hypertension: Secondary | ICD-10-CM | POA: Insufficient documentation

## 2022-10-09 DIAGNOSIS — S2242XA Multiple fractures of ribs, left side, initial encounter for closed fracture: Secondary | ICD-10-CM | POA: Diagnosis not present

## 2022-10-09 DIAGNOSIS — M549 Dorsalgia, unspecified: Secondary | ICD-10-CM | POA: Insufficient documentation

## 2022-10-09 DIAGNOSIS — R531 Weakness: Secondary | ICD-10-CM | POA: Diagnosis not present

## 2022-10-09 DIAGNOSIS — D7389 Other diseases of spleen: Secondary | ICD-10-CM | POA: Diagnosis not present

## 2022-10-09 DIAGNOSIS — K661 Hemoperitoneum: Secondary | ICD-10-CM

## 2022-10-09 DIAGNOSIS — R079 Chest pain, unspecified: Secondary | ICD-10-CM | POA: Diagnosis not present

## 2022-10-09 DIAGNOSIS — R739 Hyperglycemia, unspecified: Secondary | ICD-10-CM | POA: Diagnosis not present

## 2022-10-09 DIAGNOSIS — S36113A Laceration of liver, unspecified degree, initial encounter: Secondary | ICD-10-CM | POA: Diagnosis not present

## 2022-10-09 DIAGNOSIS — X58XXXA Exposure to other specified factors, initial encounter: Secondary | ICD-10-CM | POA: Insufficient documentation

## 2022-10-09 DIAGNOSIS — R0781 Pleurodynia: Secondary | ICD-10-CM | POA: Diagnosis not present

## 2022-10-09 DIAGNOSIS — R000 Tachycardia, unspecified: Secondary | ICD-10-CM | POA: Insufficient documentation

## 2022-10-09 DIAGNOSIS — S22009A Unspecified fracture of unspecified thoracic vertebra, initial encounter for closed fracture: Secondary | ICD-10-CM | POA: Diagnosis not present

## 2022-10-09 DIAGNOSIS — Z743 Need for continuous supervision: Secondary | ICD-10-CM | POA: Diagnosis not present

## 2022-10-09 LAB — RESP PANEL BY RT-PCR (RSV, FLU A&B, COVID)  RVPGX2
Influenza A by PCR: NEGATIVE
Influenza B by PCR: NEGATIVE
Resp Syncytial Virus by PCR: NEGATIVE
SARS Coronavirus 2 by RT PCR: NEGATIVE

## 2022-10-09 LAB — LACTIC ACID, PLASMA: Lactic Acid, Venous: 3.1 mmol/L (ref 0.5–1.9)

## 2022-10-09 LAB — TROPONIN I (HIGH SENSITIVITY)
Troponin I (High Sensitivity): 10 ng/L (ref <18)
Troponin I (High Sensitivity): 15 ng/L (ref <18)

## 2022-10-09 LAB — BASIC METABOLIC PANEL
Anion gap: 17 — ABNORMAL HIGH (ref 5–15)
BUN: 27 mg/dL — ABNORMAL HIGH (ref 6–20)
CO2: 15 mmol/L — ABNORMAL LOW (ref 22–32)
Calcium: 9.6 mg/dL (ref 8.9–10.3)
Chloride: 104 mmol/L (ref 98–111)
Creatinine, Ser: 1.8 mg/dL — ABNORMAL HIGH (ref 0.44–1.00)
GFR, Estimated: 32 mL/min — ABNORMAL LOW (ref >60)
Glucose, Bld: 237 mg/dL — ABNORMAL HIGH (ref 70–99)
Potassium: 4.1 mmol/L (ref 3.5–5.1)
Sodium: 136 mmol/L (ref 135–145)

## 2022-10-09 LAB — HEPATIC FUNCTION PANEL
ALT: 41 U/L (ref 0–44)
AST: 64 U/L — ABNORMAL HIGH (ref 15–41)
Albumin: 4.2 g/dL (ref 3.5–5.0)
Alkaline Phosphatase: 73 U/L (ref 38–126)
Bilirubin, Direct: <0.1 mg/dL (ref 0.0–0.2)
Total Bilirubin: 0.7 mg/dL (ref 0.3–1.2)
Total Protein: 7.7 g/dL (ref 6.5–8.1)

## 2022-10-09 LAB — CBC
HCT: 35 % — ABNORMAL LOW (ref 36.0–46.0)
Hemoglobin: 11.7 g/dL — ABNORMAL LOW (ref 12.0–15.0)
MCH: 32.3 pg (ref 26.0–34.0)
MCHC: 33.4 g/dL (ref 30.0–36.0)
MCV: 96.7 fL (ref 80.0–100.0)
Platelets: 280 10*3/uL (ref 150–400)
RBC: 3.62 MIL/uL — ABNORMAL LOW (ref 3.87–5.11)
RDW: 12.9 % (ref 11.5–15.5)
WBC: 14.5 10*3/uL — ABNORMAL HIGH (ref 4.0–10.5)
nRBC: 0 % (ref 0.0–0.2)

## 2022-10-09 LAB — HEMOGLOBIN AND HEMATOCRIT, BLOOD
HCT: 30.3 % — ABNORMAL LOW (ref 36.0–46.0)
Hemoglobin: 10.3 g/dL — ABNORMAL LOW (ref 12.0–15.0)

## 2022-10-09 LAB — APTT: aPTT: 29 seconds (ref 24–36)

## 2022-10-09 LAB — LIPASE, BLOOD: Lipase: 35 U/L (ref 11–51)

## 2022-10-09 LAB — PROTIME-INR
INR: 1.1 (ref 0.8–1.2)
Prothrombin Time: 13.8 seconds (ref 11.4–15.2)

## 2022-10-09 MED ORDER — MORPHINE SULFATE (PF) 4 MG/ML IV SOLN
4.0000 mg | Freq: Once | INTRAVENOUS | Status: AC
  Administered 2022-10-09: 4 mg via INTRAVENOUS
  Filled 2022-10-09: qty 1

## 2022-10-09 MED ORDER — SODIUM CHLORIDE 0.9 % IV BOLUS
1000.0000 mL | Freq: Once | INTRAVENOUS | Status: AC
  Administered 2022-10-09: 1000 mL via INTRAVENOUS

## 2022-10-09 MED ORDER — SODIUM CHLORIDE 0.9 % IV SOLN
1.0000 g | Freq: Once | INTRAVENOUS | Status: AC
  Administered 2022-10-09: 1 g via INTRAVENOUS
  Filled 2022-10-09: qty 10

## 2022-10-09 MED ORDER — SODIUM CHLORIDE 0.9 % IV BOLUS
500.0000 mL | Freq: Once | INTRAVENOUS | Status: AC
  Administered 2022-10-09: 500 mL via INTRAVENOUS

## 2022-10-09 MED ORDER — DIAZEPAM 5 MG/ML IJ SOLN
10.0000 mg | Freq: Once | INTRAMUSCULAR | Status: AC
  Administered 2022-10-09: 10 mg via INTRAVENOUS
  Filled 2022-10-09: qty 2

## 2022-10-09 MED ORDER — IOHEXOL 350 MG/ML SOLN
75.0000 mL | Freq: Once | INTRAVENOUS | Status: AC | PRN
  Administered 2022-10-09: 100 mL via INTRAVENOUS

## 2022-10-09 MED ORDER — SODIUM CHLORIDE 0.9 % IV SOLN
500.0000 mg | Freq: Once | INTRAVENOUS | Status: AC
  Administered 2022-10-09: 500 mg via INTRAVENOUS
  Filled 2022-10-09: qty 5

## 2022-10-09 MED ORDER — TRANEXAMIC ACID-NACL 1000-0.7 MG/100ML-% IV SOLN
1000.0000 mg | INTRAVENOUS | Status: AC
  Administered 2022-10-09: 1000 mg via INTRAVENOUS
  Filled 2022-10-09: qty 100

## 2022-10-09 MED ORDER — ADULT MULTIVITAMIN W/MINERALS CH
1.0000 | ORAL_TABLET | Freq: Every day | ORAL | Status: DC
  Administered 2022-10-09: 1 via ORAL
  Filled 2022-10-09: qty 1

## 2022-10-09 MED ORDER — THIAMINE MONONITRATE 100 MG PO TABS
100.0000 mg | ORAL_TABLET | Freq: Every day | ORAL | Status: DC
  Administered 2022-10-09: 100 mg via ORAL
  Filled 2022-10-09: qty 1

## 2022-10-09 MED ORDER — IOHEXOL 300 MG/ML  SOLN
50.0000 mL | Freq: Once | INTRAMUSCULAR | Status: AC | PRN
  Administered 2022-10-09: 50 mL via INTRAVENOUS

## 2022-10-09 MED ORDER — FOLIC ACID 1 MG PO TABS
1.0000 mg | ORAL_TABLET | Freq: Every day | ORAL | Status: DC
  Administered 2022-10-09: 1 mg via ORAL
  Filled 2022-10-09: qty 1

## 2022-10-09 MED ORDER — TRANEXAMIC ACID FOR EPISTAXIS: 500.0000 mg | Freq: Once | TOPICAL | Status: DC

## 2022-10-09 MED ORDER — LORAZEPAM 1 MG PO TABS: 1.0000 mg | ORAL_TABLET | ORAL | Status: DC | PRN

## 2022-10-09 MED ORDER — ONDANSETRON HCL 4 MG/2ML IJ SOLN
4.0000 mg | Freq: Once | INTRAMUSCULAR | Status: AC
  Administered 2022-10-09: 4 mg via INTRAVENOUS
  Filled 2022-10-09: qty 2

## 2022-10-09 MED ORDER — THIAMINE HCL 100 MG/ML IJ SOLN
100.0000 mg | Freq: Every day | INTRAMUSCULAR | Status: DC
  Filled 2022-10-09: qty 2

## 2022-10-09 NOTE — ED Notes (Signed)
EMTALA reviewed by this RN.  

## 2022-10-09 NOTE — ED Notes (Signed)
Facesheet faxed, Zack to [employer]

## 2022-10-09 NOTE — ED Notes (Signed)
Called [facility] for possible transfer pre Dr. Jacelyn Grip

## 2022-10-09 NOTE — ED Notes (Signed)
[facility] called for transfer, Ruby to page out trauma surgery per Dr. Jacelyn Grip.

## 2022-10-09 NOTE — ED Triage Notes (Signed)
Pt in via EMS from home with c/o left arm and left back pain since Wednesday. Pt was seen at her PCP and dx'd with a pulled muscle but pt reports still in pain. HR 112, 95% RA, 118/65, 97.9 temp, cbg 269

## 2022-10-09 NOTE — ED Provider Notes (Signed)
[facility] Provider Note    Event Date/Time   First MD Initiated Contact with Patient 10/09/22 1607     (approximate)   History   Shortness of Breath, Chest Pain, and Back Pain   HPI  Mary Christian is a 62 y.o. female   Past medical history of fibroid, anxiety, GERD, hypertension who presents to the emergency department with left arm/chest pain over the past 1 week worsening.  Started bilateral arms but now only left-sided worsening, severe today.  Denies shortness of breath, cough, fever or other respiratory infectious symptoms.  She has no other acute medical complaints and denies any inciting events or injuries to elicit this pain.  States the pain is mostly in the left shoulder/arm/underarm and radiates to the back, some involvement of the chest as well and is severe and pleuritic in nature.  She was initially hypertensive 170/80 and tachycardic 120s.   Independent Historian contributed to assessment above: Husband  External Medical Documents Reviewed: Discharge summary from September 2023 when she was diagnosed with pneumonia     Physical Exam   Triage Vital Signs: ED Triage Vitals  Enc Vitals Group     BP 10/09/22 1540 (!) 174/86     Pulse Rate 10/09/22 1540 99     Resp 10/09/22 1540 17     Temp 10/09/22 1540 97.8 F (36.6 C)     Temp src --      SpO2 10/09/22 1540 95 %     Weight 10/09/22 1544 157 lb (71.2 kg)     Height 10/09/22 1544 '5\' 5"'$  (1.651 m)     Head Circumference --      Peak Flow --      Pain Score 10/09/22 1543 10     Pain Loc --      Pain Edu? --      Excl. in [place]? --     Most recent vital signs: Vitals:   10/09/22 2100 10/09/22 2200  BP: 124/72 (!) 98/59  Pulse: (!) 121 (!) 118  Resp: (!) 24 17  Temp:    SpO2: 90% 94%    General: Awake, no distress.  CV:  Good peripheral perfusion.  Resp:  Normal effort.  Abd:  No distention.  Other:  Awake alert oriented   ED Results / Procedures / Treatments    Labs (all labs ordered are listed, but only abnormal results are displayed) Labs Reviewed  BASIC METABOLIC PANEL - Abnormal; Notable for the following components:      Result Value   CO2 15 (*)    Glucose, Bld 237 (*)    BUN 27 (*)    Creatinine, Ser 1.80 (*)    GFR, Estimated 32 (*)    Anion gap 17 (*)    All other components within normal limits  CBC - Abnormal; Notable for the following components:   WBC 14.5 (*)    RBC 3.62 (*)    Hemoglobin 11.7 (*)    HCT 35.0 (*)    All other components within normal limits  HEPATIC FUNCTION PANEL - Abnormal; Notable for the following components:   AST 64 (*)    All other components within normal limits  HEMOGLOBIN AND HEMATOCRIT, BLOOD - Abnormal; Notable for the following components:   Hemoglobin 10.3 (*)    HCT 30.3 (*)    All other components within normal limits  LACTIC ACID, PLASMA - Abnormal; Notable for the following components:   Lactic Acid, Venous 3.1 (*)  All other components within normal limits  RESP PANEL BY RT-PCR (RSV, FLU A&B, COVID)  RVPGX2  LIPASE, BLOOD  PROTIME-INR  APTT  LACTIC ACID, PLASMA  TROPONIN I (HIGH SENSITIVITY)  TROPONIN I (HIGH SENSITIVITY)     I ordered and reviewed the above labs they are notable for white blood cell count is elevated at 14, hemoglobin 11.7  EKG  ED ECG REPORT I, Lucillie Garfinkel, the attending physician, personally viewed and interpreted this ECG.   Date: 10/09/2022  EKG Time: 1553  Rate: 126  Rhythm: sinus tachy  Axis: nl  Intervals:none  ST&T Change: No acute ischemic changes    RADIOLOGY I independently reviewed and interpreted chest x-ray see no obvious focalities or pneumothorax   PROCEDURES:  Critical Care performed: Yes, see critical care procedure note(s)  .Critical Care  Performed by: Lucillie Garfinkel, MD Authorized by: Lucillie Garfinkel, MD   Critical care provider statement:    Critical care time (minutes):  30   Critical care was necessary to treat or  prevent imminent or life-threatening deterioration of the following conditions:  Trauma   Critical care was time spent personally by me on the following activities:  Development of treatment plan with patient or surrogate, discussions with consultants, evaluation of patient's response to treatment, examination of patient, ordering and review of laboratory studies, ordering and review of radiographic studies, ordering and performing treatments and interventions, pulse oximetry, re-evaluation of patient's condition and review of old charts    MEDICATIONS ORDERED IN ED: Medications  LORazepam (ATIVAN) tablet 1-4 mg (has no administration in time range)  thiamine (VITAMIN B1) tablet 100 mg (100 mg Oral Given 10/09/22 2021)    Or  thiamine (VITAMIN B1) injection 100 mg ( Intravenous See Alternative 3/79/02 4097)  folic acid (FOLVITE) tablet 1 mg (1 mg Oral Given 10/09/22 2021)  multivitamin with minerals tablet 1 tablet (1 tablet Oral Given 10/09/22 2021)  tranexamic acid (CYKLOKAPRON) IVPB 1,000 mg (has no administration in time range)  sodium chloride 0.9 % bolus 500 mL (has no administration in time range)  morphine (PF) 4 MG/ML injection 4 mg (4 mg Intravenous Given 10/09/22 1641)  sodium chloride 0.9 % bolus 1,000 mL (0 mLs Intravenous Stopped 10/09/22 1739)  ondansetron (ZOFRAN) injection 4 mg (4 mg Intravenous Given 10/09/22 1640)  iohexol (OMNIPAQUE) 350 MG/ML injection 75 mL (100 mLs Intravenous Contrast Given 10/09/22 1805)  morphine (PF) 4 MG/ML injection 4 mg (4 mg Intravenous Given 10/09/22 1904)  cefTRIAXone (ROCEPHIN) 1 g in sodium chloride 0.9 % 100 mL IVPB (0 g Intravenous Stopped 10/09/22 2101)  azithromycin (ZITHROMAX) 500 mg in sodium chloride 0.9 % 250 mL IVPB (0 mg Intravenous Stopped 10/09/22 2224)  diazepam (VALIUM) injection 10 mg (10 mg Intravenous Given 10/09/22 2021)  morphine (PF) 4 MG/ML injection 4 mg (4 mg Intravenous Given 10/09/22 2040)  diazepam (VALIUM) injection 10 mg (10  mg Intravenous Given 10/09/22 2103)  diazepam (VALIUM) injection 10 mg (10 mg Intravenous Given 10/09/22 2224)  iohexol (OMNIPAQUE) 300 MG/ML solution 50 mL (50 mLs Intravenous Contrast Given 10/09/22 2231)    External physician / consultants:  I spoke with trauma consultant Zacarias Pontes Dr. Mayo Ao regarding care plan for this patient.   IMPRESSION / MDM / ASSESSMENT AND PLAN / ED COURSE  I reviewed the triage vital signs and the nursing notes.  Patient's presentation is most consistent with acute presentation with potential threat to life or bodily function.  Differential diagnosis includes, but is not limited to, PE, upper extremity DVT, ACS, dissection, respiratory infection, musculoskeletal pain, costochondritis, pneumothorax   The patient is on the cardiac monitor to evaluate for evidence of arrhythmia and/or significant heart rate changes.  MDM: Patient with chest pain atypical for ACS consider PE given pleurisy and tachycardia, will obtain chest x-ray and CT angiogram of the chest, give pain control, EKG and troponins.  Radial pulses intact equal bilaterally and blood pressures come down with some pain control, I think less likely dissection.     This patient was found to have rib fractures on her CT angiogram multiple left-sided without hemothorax or pneumothorax.  I reviewed these findings with the patient who then admitted that she had sustained a trauma last night after drinking a significant amount of alcohol.  The CT chest also showed some free fluid in the abdomen so a CT abdomen and pelvis was added on to assess for traumatic injuries to the abdomen and pelvis without IV contrast given previous contrast load.   At this time she was found to be tachycardic and had some hand tremors and tongue fasciculation consistent with alcohol withdrawal.  She admitted to significant alcohol use a bottle and a half of wine per day and injury sustained last night  while she was intoxicated.  She was started on CIWA and given 2 doses of IV Valium 10 mg with good effect.  Tremors improved and her tachycardia got slightly improved down to 100-110s.  Her CT of the abdomen pelvis showed grade 2 splenic laceration and small hemoperitoneum.  I discussed with [place] trauma service for transfer but they had no beds and assessed the findings on CT scan, as well as clinical signs and symptoms from patient and advised that the patient could stay at [facility] regional for further monitoring and intervention as needed if decompensated.  Spoke with Dr. Dahlia Byes of general surgery at [facility] regional who stated that this patient should be transferred to trauma center given resource limitation in case of decompensation here at [facility].  I reconsulted with [place] trauma surgeon who stated unfortunately there were no beds available for transfer.  I consulted with [facility] who unfortunately had no beds as well.  I then started the transfer process to [facility].  I reassessed the patient who continues to be normotensive with maps greater than 65, recheck of hemoglobin was down 1 unit 11->10 but this was also in the setting of 1 L of crystalloid that was given between measurements.  Continued to be awake alert oriented.  I ordered for TXA.  Continue close monitoring.  At the advice of trauma surgeon from [place], I added on a CT abdomen pelvis with IV contrast to further assess injury and to assess for increase in hemoperitoneum.  At the time of signout, pending transfer process to trauma center.        FINAL CLINICAL IMPRESSION(S) / ED DIAGNOSES   Final diagnoses:  Closed fracture of multiple ribs of left side, initial encounter  Laceration of spleen, initial encounter  Hemoperitoneum     Rx / DC Orders   ED Discharge Orders     None        Note:  This document was prepared using Dragon voice recognition software and may include unintentional  dictation errors.    Lucillie Garfinkel, MD 10/09/22 2240

## 2022-10-09 NOTE — ED Notes (Signed)
[facility] called for possible transfer per Dr. Jacelyn Grip

## 2022-10-09 NOTE — ED Triage Notes (Signed)
Pt arrived via EMS from home. Reports pain that started in her right arm on Wednesday that has now moved to her left arm. Pt does report intermittent cp and sob since this morning. Pt AxOx4.

## 2022-10-09 NOTE — ED Notes (Signed)
Accepted to [facility] ED per Dr. Jacelyn Grip. [facility] called for transport, no trucks available tonight. Will call [facility] for availability

## 2022-10-10 DIAGNOSIS — S2242XA Multiple fractures of ribs, left side, initial encounter for closed fracture: Secondary | ICD-10-CM | POA: Diagnosis not present

## 2022-10-10 DIAGNOSIS — R55 Syncope and collapse: Secondary | ICD-10-CM | POA: Diagnosis not present

## 2022-10-10 DIAGNOSIS — S3993XA Unspecified injury of pelvis, initial encounter: Secondary | ICD-10-CM | POA: Diagnosis not present

## 2022-10-10 DIAGNOSIS — D62 Acute posthemorrhagic anemia: Secondary | ICD-10-CM | POA: Diagnosis not present

## 2022-10-10 DIAGNOSIS — S22019A Unspecified fracture of first thoracic vertebra, initial encounter for closed fracture: Secondary | ICD-10-CM | POA: Diagnosis not present

## 2022-10-10 DIAGNOSIS — F419 Anxiety disorder, unspecified: Secondary | ICD-10-CM | POA: Diagnosis not present

## 2022-10-10 DIAGNOSIS — I10 Essential (primary) hypertension: Secondary | ICD-10-CM | POA: Diagnosis not present

## 2022-10-10 DIAGNOSIS — R000 Tachycardia, unspecified: Secondary | ICD-10-CM | POA: Diagnosis not present

## 2022-10-10 DIAGNOSIS — I6523 Occlusion and stenosis of bilateral carotid arteries: Secondary | ICD-10-CM | POA: Diagnosis not present

## 2022-10-10 DIAGNOSIS — W182XXA Fall in (into) shower or empty bathtub, initial encounter: Secondary | ICD-10-CM | POA: Diagnosis not present

## 2022-10-10 DIAGNOSIS — G8918 Other acute postprocedural pain: Secondary | ICD-10-CM | POA: Diagnosis not present

## 2022-10-10 DIAGNOSIS — T1490XA Injury, unspecified, initial encounter: Secondary | ICD-10-CM | POA: Diagnosis not present

## 2022-10-10 DIAGNOSIS — S36114A Minor laceration of liver, initial encounter: Secondary | ICD-10-CM | POA: Diagnosis not present

## 2022-10-10 DIAGNOSIS — S42112A Displaced fracture of body of scapula, left shoulder, initial encounter for closed fracture: Secondary | ICD-10-CM | POA: Diagnosis not present

## 2022-10-10 DIAGNOSIS — N261 Atrophy of kidney (terminal): Secondary | ICD-10-CM | POA: Diagnosis not present

## 2022-10-10 DIAGNOSIS — S3992XA Unspecified injury of lower back, initial encounter: Secondary | ICD-10-CM | POA: Diagnosis not present

## 2022-10-10 DIAGNOSIS — J9811 Atelectasis: Secondary | ICD-10-CM | POA: Diagnosis not present

## 2022-10-10 DIAGNOSIS — S42002A Fracture of unspecified part of left clavicle, initial encounter for closed fracture: Secondary | ICD-10-CM | POA: Diagnosis not present

## 2022-10-10 DIAGNOSIS — S42115A Nondisplaced fracture of body of scapula, left shoulder, initial encounter for closed fracture: Secondary | ICD-10-CM | POA: Diagnosis not present

## 2022-10-10 DIAGNOSIS — S42022A Displaced fracture of shaft of left clavicle, initial encounter for closed fracture: Secondary | ICD-10-CM | POA: Diagnosis not present

## 2022-10-10 DIAGNOSIS — S22011A Stable burst fracture of first thoracic vertebra, initial encounter for closed fracture: Secondary | ICD-10-CM | POA: Diagnosis not present

## 2022-10-10 DIAGNOSIS — S36899A Unspecified injury of other intra-abdominal organs, initial encounter: Secondary | ICD-10-CM | POA: Diagnosis not present

## 2022-10-10 DIAGNOSIS — F1090 Alcohol use, unspecified, uncomplicated: Secondary | ICD-10-CM | POA: Diagnosis not present

## 2022-10-10 DIAGNOSIS — W06XXXA Fall from bed, initial encounter: Secondary | ICD-10-CM | POA: Diagnosis not present

## 2022-10-10 DIAGNOSIS — S42102A Fracture of unspecified part of scapula, left shoulder, initial encounter for closed fracture: Secondary | ICD-10-CM | POA: Diagnosis not present

## 2022-10-10 DIAGNOSIS — S36115A Moderate laceration of liver, initial encounter: Secondary | ICD-10-CM | POA: Diagnosis not present

## 2022-10-10 DIAGNOSIS — Z9889 Other specified postprocedural states: Secondary | ICD-10-CM | POA: Diagnosis not present

## 2022-10-10 DIAGNOSIS — G8911 Acute pain due to trauma: Secondary | ICD-10-CM | POA: Diagnosis not present

## 2022-10-10 DIAGNOSIS — S225XXA Flail chest, initial encounter for closed fracture: Secondary | ICD-10-CM | POA: Diagnosis not present

## 2022-10-10 DIAGNOSIS — S0990XA Unspecified injury of head, initial encounter: Secondary | ICD-10-CM | POA: Diagnosis not present

## 2022-10-10 DIAGNOSIS — S36032A Major laceration of spleen, initial encounter: Secondary | ICD-10-CM | POA: Diagnosis not present

## 2022-10-10 DIAGNOSIS — S2249XA Multiple fractures of ribs, unspecified side, initial encounter for closed fracture: Secondary | ICD-10-CM | POA: Diagnosis not present

## 2022-10-10 DIAGNOSIS — S199XXA Unspecified injury of neck, initial encounter: Secondary | ICD-10-CM | POA: Diagnosis not present

## 2022-10-10 DIAGNOSIS — R0789 Other chest pain: Secondary | ICD-10-CM | POA: Diagnosis not present

## 2022-10-10 DIAGNOSIS — S7002XA Contusion of left hip, initial encounter: Secondary | ICD-10-CM | POA: Diagnosis not present

## 2022-10-10 DIAGNOSIS — E785 Hyperlipidemia, unspecified: Secondary | ICD-10-CM | POA: Diagnosis not present

## 2022-10-10 DIAGNOSIS — S36039A Unspecified laceration of spleen, initial encounter: Secondary | ICD-10-CM | POA: Diagnosis not present

## 2022-10-10 DIAGNOSIS — S3991XA Unspecified injury of abdomen, initial encounter: Secondary | ICD-10-CM | POA: Diagnosis not present

## 2022-10-10 DIAGNOSIS — R918 Other nonspecific abnormal finding of lung field: Secondary | ICD-10-CM | POA: Diagnosis not present

## 2022-10-10 DIAGNOSIS — Z9981 Dependence on supplemental oxygen: Secondary | ICD-10-CM | POA: Diagnosis not present

## 2022-10-10 DIAGNOSIS — J90 Pleural effusion, not elsewhere classified: Secondary | ICD-10-CM | POA: Diagnosis not present

## 2022-10-10 DIAGNOSIS — J9601 Acute respiratory failure with hypoxia: Secondary | ICD-10-CM | POA: Diagnosis not present

## 2022-10-10 DIAGNOSIS — S42032A Displaced fracture of lateral end of left clavicle, initial encounter for closed fracture: Secondary | ICD-10-CM | POA: Diagnosis not present

## 2022-10-10 DIAGNOSIS — E872 Acidosis, unspecified: Secondary | ICD-10-CM | POA: Diagnosis not present

## 2022-10-14 DIAGNOSIS — S42102A Fracture of unspecified part of scapula, left shoulder, initial encounter for closed fracture: Secondary | ICD-10-CM | POA: Diagnosis not present

## 2022-10-14 DIAGNOSIS — S36032A Major laceration of spleen, initial encounter: Secondary | ICD-10-CM | POA: Diagnosis not present

## 2022-10-14 DIAGNOSIS — M5032 Other cervical disc degeneration, mid-cervical region, unspecified level: Secondary | ICD-10-CM | POA: Diagnosis not present

## 2022-10-14 DIAGNOSIS — S36114A Minor laceration of liver, initial encounter: Secondary | ICD-10-CM | POA: Diagnosis not present

## 2022-10-14 DIAGNOSIS — T1490XA Injury, unspecified, initial encounter: Secondary | ICD-10-CM | POA: Diagnosis not present

## 2022-10-14 DIAGNOSIS — S2242XA Multiple fractures of ribs, left side, initial encounter for closed fracture: Secondary | ICD-10-CM | POA: Diagnosis not present

## 2022-10-14 DIAGNOSIS — M47812 Spondylosis without myelopathy or radiculopathy, cervical region: Secondary | ICD-10-CM | POA: Diagnosis not present

## 2022-10-15 ENCOUNTER — Telehealth: Payer: Self-pay | Admitting: Family Medicine

## 2022-10-15 DIAGNOSIS — S2242XD Multiple fractures of ribs, left side, subsequent encounter for fracture with routine healing: Secondary | ICD-10-CM | POA: Diagnosis not present

## 2022-10-15 DIAGNOSIS — S42022A Displaced fracture of shaft of left clavicle, initial encounter for closed fracture: Secondary | ICD-10-CM | POA: Diagnosis not present

## 2022-10-15 DIAGNOSIS — S36113A Laceration of liver, unspecified degree, initial encounter: Secondary | ICD-10-CM | POA: Diagnosis not present

## 2022-10-15 NOTE — Telephone Encounter (Signed)
Home Health Verbal Orders - Caller/Agency: Gerald Stabs from [facility] Callback Number: [phone] Requesting PT Frequency:  1x1 2x1 1x3

## 2022-10-15 NOTE — Telephone Encounter (Signed)
Ok for verbal orders.    Andreya Lacks [name], MD  [facility]  

## 2022-10-19 DIAGNOSIS — S42022A Displaced fracture of shaft of left clavicle, initial encounter for closed fracture: Secondary | ICD-10-CM | POA: Diagnosis not present

## 2022-10-19 DIAGNOSIS — S2242XD Multiple fractures of ribs, left side, subsequent encounter for fracture with routine healing: Secondary | ICD-10-CM | POA: Diagnosis not present

## 2022-10-19 DIAGNOSIS — S36113A Laceration of liver, unspecified degree, initial encounter: Secondary | ICD-10-CM | POA: Diagnosis not present

## 2022-10-19 NOTE — Telephone Encounter (Signed)
Verbal orders given

## 2022-10-21 ENCOUNTER — Ambulatory Visit: Payer: Self-pay | Admitting: *Deleted

## 2022-10-21 DIAGNOSIS — S42022A Displaced fracture of shaft of left clavicle, initial encounter for closed fracture: Secondary | ICD-10-CM | POA: Diagnosis not present

## 2022-10-21 DIAGNOSIS — S2242XD Multiple fractures of ribs, left side, subsequent encounter for fracture with routine healing: Secondary | ICD-10-CM | POA: Diagnosis not present

## 2022-10-21 DIAGNOSIS — S36113A Laceration of liver, unspecified degree, initial encounter: Secondary | ICD-10-CM | POA: Diagnosis not present

## 2022-10-21 NOTE — Telephone Encounter (Signed)
Summary: Pt broke some ribs and her collar bone and request Rx for pain medication   Pt stated a few weeks ago she fell and broke some ribs and her collar bone and she would like to request Rx for pain medication to be sent to [place], [place]. Cb# [phone]          Called patient to review sx of rib and collar bone pain. No answer, [phone].

## 2022-10-21 NOTE — Telephone Encounter (Signed)
Called # [phone] work # and patient not available. Did not leave message .

## 2022-10-21 NOTE — Telephone Encounter (Signed)
Second attempt to reach pt. Left message to call back,

## 2022-10-22 DIAGNOSIS — S42022D Displaced fracture of shaft of left clavicle, subsequent encounter for fracture with routine healing: Secondary | ICD-10-CM | POA: Diagnosis not present

## 2022-10-27 ENCOUNTER — Ambulatory Visit: Payer: Self-pay | Admitting: *Deleted

## 2022-10-27 ENCOUNTER — Ambulatory Visit: Payer: 59 | Admitting: Family Medicine

## 2022-10-27 ENCOUNTER — Encounter: Payer: Self-pay | Admitting: Family Medicine

## 2022-10-27 VITALS — BP 110/68 | HR 130 | Resp 16 | Wt 162.3 lb

## 2022-10-27 DIAGNOSIS — M79609 Pain in unspecified limb: Secondary | ICD-10-CM

## 2022-10-27 DIAGNOSIS — R202 Paresthesia of skin: Secondary | ICD-10-CM

## 2022-10-27 DIAGNOSIS — R7989 Other specified abnormal findings of blood chemistry: Secondary | ICD-10-CM | POA: Insufficient documentation

## 2022-10-27 DIAGNOSIS — R29898 Other symptoms and signs involving the musculoskeletal system: Secondary | ICD-10-CM | POA: Diagnosis not present

## 2022-10-27 MED ORDER — GABAPENTIN 100 MG PO CAPS: 100.0000 mg | ORAL_CAPSULE | Freq: Every day | ORAL | 0 refills | Status: DC

## 2022-10-27 NOTE — Progress Notes (Signed)
I,Mary Christian,acting as a scribe for [employer], MD.,have documented all relevant documentation on the behalf of Mary Foster, MD,as directed by  Mary Foster, MD while in the presence of Mary Foster, MD.   Established patient visit   Patient: Mary Christian   DOB: [birth year]   62 y.o. Female  MRN: [id number] Visit Date: 10/27/2022  Today's healthcare provider: [name], MD   Chief Complaint  Patient presents with   Arm Pain   Subjective    Arm Pain  Incident onset: 2 weeks ago broke her collarbone and clavicle. Incident location: patient reports "fell off my bed" The injury mechanism was a fall. Pain location: left arm from clavicle and collarbone injury. The quality of the pain is described as aching, burning, cramping, shooting and stabbing. The pain radiates to the left hand and left arm. The pain is at a severity of 6/10. The pain is moderate. The pain has been Constant since the incident. Associated symptoms include muscle weakness, numbness and tingling. The symptoms are aggravated by movement. She has tried acetaminophen (sling "some pain medicine, can't remember name,something with Tylenol") for the symptoms.     Review of patient's EMR ED visit 10/09/22 and Orthopedics follow up visit 10/22/22  Patient sustained a clavicular fracture along with multiple rib fractures sustained 10/08/22 She was prescribed two days of oxycodone for pain and states that pain was well managed on tylenol until recently  She is now experiencing increased pain with moving her left arm and reports weakness with gripping on the left side  She is right hand dominant   She also sustained multiple left sided rib fractures as well as a splenic injury  Patient reports having numbness and swelling in all five fingers of the left hand but initially only had pain in the left  She has been completing exercises and was able to work well with  with PT and able to get in and out of vehicles  She is now experiencing increased  numbness of her left forearm, hand and fingers  that has progressed from when she was recently discharged from the hospital in Jan 2024 She has to passively stretch out of her left UE using her right UE due to pain and numbness as well as weakness  She localizes some pain to her lateral/posterior humerus The symptoms in her arm are now limiting her ability to dress herself and put on socks independently   Numbness is mostly in forearm and now is affecting all 5 fingers She is accompanied by her husband  Patient states she has been sober and abstaining from alcohol for 19 days after her initial injury  She and her spouse report that the orthopedic surgeon wants to wait until her nerve symptoms are improved prior to performing surgery on her clavicular fracture  Patient prefers to avoid opioids   Medications: Outpatient Medications Prior to Visit  Medication Sig   amLODipine (NORVASC) 10 MG tablet TAKE ONE TABLET BY MOUTH EVERY DAY   CALCIUM CITRATE-VITAMIN D PO Take 630 mg by mouth daily.   ezetimibe (ZETIA) 10 MG tablet TAKE 1 TABLET BY MOUTH DAILY   losartan (COZAAR) 100 MG tablet TAKE 1 TABLET BY MOUTH DAILY   PARoxetine (PAXIL) 20 MG tablet Take 1 tablet (20 mg total) by mouth daily.   rosuvastatin (CRESTOR) 20 MG tablet TAKE ONE TABLET BY MOUTH EVERY DAY   No facility-administered medications prior to visit.    Review of Systems  Neurological:  Positive for tingling and numbness.       Objective    BP 110/68 (BP Location: Right Arm, Patient Position: Sitting, Cuff Size: Normal)   Pulse (!) 130   Resp 16   Wt 162 lb 4.8 oz (73.6 kg)   LMP 06/16/2016   BMI 27.01 kg/m    Physical Exam Constitutional:      General: She is not in acute distress.    Appearance: Normal appearance. She is not ill-appearing.  Pulmonary:     Effort: Pulmonary effort is normal. No respiratory distress.   Musculoskeletal:     Comments: Decreased grip strength in left hand  2/5 grip on left  5/5 on right   Swelling in 1-3rd digitis on the left hand   Has swelling at wrist joint   There is dorsal ecchymosis between first and second phalanges on left hand  Both radial and ulnar pulses are intact on palpation   She has decreased ROM with wrist flexion and extension   She has hypersensitivity to touch of the fingernails   Cap refill is less than 3 seconds on left   Neurological:     Mental Status: She is alert.       No results found for any visits on 10/27/22.  Assessment & Plan     Problem List Items Addressed This Visit       Other   Weakness of left arm - Primary    Acute problem  Worsening  Decreased grip strength and limited flexion at shoulder  Urgent referral submitted for neurology given worsening weakness and paresthesias  Recommended follow up as scheduled with orthopedic surgery for known clavicular fracture as they are anticipating that the patient will have surgery in the 2-3 weeks  Follow up with me in 3 weeks pending surgery and neurology evaluation        Relevant Orders   Ambulatory referral to Neurology   Paresthesia and pain of left extremity    Acute problem  Worsening in setting of known displaced clavicular fracture  Awaiting surgical fixation for fracture  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 causing weakness, pain and paresthesias in the left upper extremity  Referral submitted to neurology for evaluation and nerve conduction studies given worsening weakness and nerve pain  Will recommend patient continue Tylenol, abstain from alcohol and will prescribe gabapentin '100mg'$  qHS  Counseled to take this medication only at bedtime as it can cause somnolence, patient voiced understanding  She will followup in 3 weeks       Relevant Medications   gabapentin (NEURONTIN) 100 MG capsule   Elevated serum creatinine   Relevant Orders   Basic Metabolic  Panel (BMET)     Return in about 3 weeks (around 11/17/2022) for left arm .       The entirety of the information documented in the History of Present Illness, Review of Systems and Physical Exam were personally obtained by me. Portions of this information were initially documented by Lyndel Pleasure, CMA and reviewed by me for thoroughness and accuracy.Mary Foster, MD     Mary Foster, MD  [facility] [phone] (phone) [phone] (fax)  [place]

## 2022-10-27 NOTE — Telephone Encounter (Signed)
Reason for Disposition  [1] Collarbone is painful AND [2] difficulty raising arm  Answer Assessment - Initial Assessment Questions 1. MECHANISM: "How did the injury happen?"     I fell and broke my  collarbone and injured my clavicle on left.   I also cracked some ribs on the left.   I fell off the bed onto a wood floor around Jan. 19th, 2024.     My left arm is numb and I can't use my hand.   It happened when I left the hospital.   It started with my pinky finger.   I've had PT.   I don't think OT will help this.    2. ONSET: "When did the injury happen?" (Minutes or hours ago)      Jan. 19, 2024 3. APPEARANCE of INJURY: "What does the injury look like?"      The hand is bruised.    The arm looks lighter in color.    It's numb in my arm and my hand is more numb.    4. SEVERITY: "Can you move the shoulder normally?"      No   I use the sling for my arm when I walk for support.    I can move the arm but it hurts.   I have to straighten it with my other arm. 5. SIZE: For cuts, bruises, or swelling, ask: "How large is it?" (e.g., inches or centimeters;  entire joint)      None 6. PAIN: "Is there pain?" If Yes, ask: "How bad is the pain?"   (e.g., Scale 1-10; or mild, moderate, severe)   - MILD (1-3): doesn't interfere with normal activities   - MODERATE (4-7): interferes with normal activities (e.g., work or school) or awakens from sleep   - SEVERE (8-10): excruciating pain, unable to do any normal activities, unable to move arm at all due to pain     Yes numbness and pain 7. TETANUS: For any breaks in the skin, ask: "When was the last tetanus booster?"     No 8. OTHER SYMPTOMS: "Do you have any other symptoms?" (e.g., loss of sensation)     See above 9. PREGNANCY: "Is there any chance you are pregnant?" "When was your last menstrual period?"     N/A due to age  Protocols used: Shoulder Injury-A-AH

## 2022-10-27 NOTE — Assessment & Plan Note (Signed)
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

## 2022-10-27 NOTE — Telephone Encounter (Signed)
  Chief Complaint: Left arm and hand are numb from a collarbone and clavicle injury from a fall on 10/08/2022.  Was in hospital 5 days. Symptoms: Left arm is numb and painful.   Hand is very numb.   She can move her arm.   Can't use her hand.  She has had PT since the fall. Frequency: [place] off the bed onto a wooden floor on 10/08/2022. Pertinent Negatives: Patient denies N/A Disposition: '[]'$ ED /'[]'$ Urgent Care (no appt availability in office) / '[x]'$ Appointment(In office/virtual)/ '[]'$  [facility]/ '[]'$ Home Care/ '[]'$ Refused Recommended Disposition /'[]'$ [facility]/ '[]'$  Follow-up with PCP Additional Notes: Appt. Made for today at 1:00 with Dr. Alba Cory.

## 2022-10-27 NOTE — Assessment & Plan Note (Signed)
Acute problem  Worsening  Decreased grip strength and limited flexion at shoulder  Urgent referral submitted for neurology given worsening weakness and paresthesias  Recommended follow up as scheduled with orthopedic surgery for known clavicular fracture as they are anticipating that the patient will have surgery in the 2-3 weeks  Follow up with me in 3 weeks pending surgery and neurology evaluation

## 2022-10-28 LAB — BASIC METABOLIC PANEL
BUN/Creatinine Ratio: 13 (ref 12–28)
BUN: 13 mg/dL (ref 8–27)
CO2: 18 mmol/L — ABNORMAL LOW (ref 20–29)
Calcium: 9.9 mg/dL (ref 8.7–10.3)
Chloride: 100 mmol/L (ref 96–106)
Creatinine, Ser: 0.99 mg/dL (ref 0.57–1.00)
Glucose: 133 mg/dL — ABNORMAL HIGH (ref 70–99)
Potassium: 4.2 mmol/L (ref 3.5–5.2)
Sodium: 140 mmol/L (ref 134–144)
eGFR: 65 mL/min/{1.73_m2} (ref >59)

## 2022-10-29 ENCOUNTER — Telehealth: Payer: Self-pay

## 2022-10-29 ENCOUNTER — Ambulatory Visit: Payer: Self-pay

## 2022-10-29 DIAGNOSIS — R202 Paresthesia of skin: Secondary | ICD-10-CM

## 2022-10-29 MED ORDER — OXYCODONE-ACETAMINOPHEN 7.5-325 MG PO TABS: 1.0000 | ORAL_TABLET | Freq: Four times a day (QID) | ORAL | 0 refills | Status: DC | PRN

## 2022-10-29 NOTE — Telephone Encounter (Signed)
[facility] called, spoke with Judeen Hammans, [facility] who states they didn't receive call in for Percocet and with it being narcotic it has to be sent electronic or paper script. Advised I would call on call to see if it can be e-scribed.   On call provider called, reviewed above with him and asked if Percocet 7.5-325 could be sent electronic. He stated he would review and send in if everything looked good. No further assistance needed.

## 2022-10-29 NOTE — Telephone Encounter (Signed)
Copied from [place]. Topic: Referral - Request for Referral >> Oct 29, 2022  2:23 PM Everette C wrote: Has patient seen PCP for this complaint? Yes.   *If NO, is insurance requiring patient see PCP for this issue before PCP can refer them? Referral for which specialty: Neurology  Preferred provider/office: Patient has no preference but would like to be seen sooner  Reason for referral: shoulder concerns

## 2022-10-29 NOTE — Addendum Note (Signed)
Addended by: Birdie Sons on: 10/29/2022 05:58 PM   Modules accepted: Orders

## 2022-10-29 NOTE — Telephone Encounter (Signed)
Will prescribe medication for pain.  If pain continues to worsening or she has worsening swelling, I would recommend ED evaluation given the nature of injury and concern for nerve involvement with symptoms of weakness.   [name], MD  [facility]

## 2022-10-29 NOTE — Telephone Encounter (Signed)
Summary: discomfort in back and shoulder / rx concern   The patient has been previously prescribed gabapentin (NEURONTIN) 100 MG capsule [id number]  The patient shares that the medication has been ineffective  The patient would like to be prescribed something stronger for their discomfort  The patient shares that they are continuing to experience discomfort in their shoulder, arm, back and ribs  Please contact further when possible      Called pt - Left message on machine to call back.

## 2022-10-29 NOTE — Telephone Encounter (Signed)
2nd attempt, Patient called, left VM to return the call to the office to discuss symptoms with a nurse.  Summary: discomfort in back and shoulder / rx concern   The patient has been previously prescribed gabapentin (NEURONTIN) 100 MG capsule [id number]  The patient shares that the medication has been ineffective  The patient would like to be prescribed something stronger for their discomfort  The patient shares that they are continuing to experience discomfort in their shoulder, arm, back and ribs  Please contact further when possible

## 2022-10-29 NOTE — Telephone Encounter (Signed)
  Chief Complaint: arm pain  Symptoms: L arm pain into L shoulder and back and hand, 8/10 Frequency: several weeks Pertinent Negatives: NA Disposition: '[]'$ ED /'[]'$ Urgent Care (no appt availability in office) / '[]'$ Appointment(In office/virtual)/ '[]'$  [place] [facility]/ '[]'$ Home Care/ '[]'$ Refused Recommended Disposition /'[]'$ [place] [facility]/ '[x]'$  Follow-up with PCP Additional Notes: pt states she is taking Gabapentin '100mg'$  QHS and not getting any relief at all. Pt called Neurology and was told they are scheduling about 1 month out. Pt doesn't want to wait that long to be seen so advised her since she is in network with [facility] calling their Neurology office to see if they can see her sooner. Pt was also taking gabapentin '200mg'$  TID and getting some relief so advised I would call and speak with CMA to see if that dosage can be resumed or not. Pt verbalized understanding. Spoke with Joseline CMA and informed about medication dosage difference and referral and states she will send message to provider. Advised I will send my notes as well.   Summary: discomfort in back and shoulder / rx concern    The patient has been previously prescribed gabapentin (NEURONTIN) 100 MG capsule [[id number]]  The patient shares that the medication has been ineffective  The patient would like to be prescribed something stronger for their discomfort  The patient shares that they are continuing to experience discomfort in their shoulder, arm, back and ribs  Please contact further when possible     Answer Assessment - Initial Assessment Questions 1. ONSET: "When did the pain start?"     Several weeks 2. LOCATION: "Where is the pain located?"     L arm that radiates into L shoulder into back  3. PAIN: "How bad is the pain?" (Scale 1-10; or mild, moderate, severe)   - MILD (1-3): Doesn't interfere with normal activities.   - MODERATE (4-7): Interferes with normal activities (e.g., work or school) or awakens from sleep.   -  SEVERE (8-10): Excruciating pain, unable to do any normal activities, unable to hold a cup of water.     8/10 6. OTHER SYMPTOMS: "Do you have any other symptoms?" (e.g., neck pain, swelling, rash, fever, numbness, weakness)     Swelling in hand, numbness, weakness  Protocols used: Arm Pain-A-AH

## 2022-10-29 NOTE — Telephone Encounter (Signed)
Referral was previously placed during office visit on 10/27/22.   Referral notes reviewed with the following message:   "Referral sent to [facility] neurology My chart message sent to pt with office contact information"  10/27/22 - Otelia Limes, MD  [facility]  10/29/22           .

## 2022-11-02 ENCOUNTER — Other Ambulatory Visit: Payer: Self-pay | Admitting: Family Medicine

## 2022-11-02 ENCOUNTER — Telehealth: Payer: Self-pay

## 2022-11-02 ENCOUNTER — Ambulatory Visit: Payer: Self-pay

## 2022-11-02 ENCOUNTER — Other Ambulatory Visit: Payer: Self-pay | Admitting: Cardiovascular Disease

## 2022-11-02 DIAGNOSIS — M79609 Pain in unspecified limb: Secondary | ICD-10-CM

## 2022-11-02 DIAGNOSIS — S42032G Displaced fracture of lateral end of left clavicle, subsequent encounter for fracture with delayed healing: Secondary | ICD-10-CM

## 2022-11-02 MED ORDER — OXYCODONE-ACETAMINOPHEN 7.5-325 MG PO TABS: 1.0000 | ORAL_TABLET | Freq: Four times a day (QID) | ORAL | 0 refills | Status: AC | PRN

## 2022-11-02 MED ORDER — AMLODIPINE BESYLATE 10 MG PO TABS: 10.0000 mg | ORAL_TABLET | Freq: Every day | ORAL | 0 refills | Status: DC

## 2022-11-02 MED ORDER — LOSARTAN POTASSIUM 100 MG PO TABS: 100.0000 mg | ORAL_TABLET | Freq: Every day | ORAL | 0 refills | Status: DC

## 2022-11-02 MED ORDER — GABAPENTIN 100 MG PO CAPS: 100.0000 mg | ORAL_CAPSULE | Freq: Three times a day (TID) | ORAL | 1 refills | Status: DC

## 2022-11-02 NOTE — Telephone Encounter (Signed)
Pt scheduled on 4/22

## 2022-11-02 NOTE — Telephone Encounter (Signed)
Copied from [place]. Topic: Referral - Question >> Nov 02, 2022 12:13 PM Oley Balm A wrote: Reason for CRM: Pt states that she just spoke with the orthopedic specialist and she is out of network. She will need a referral to a new orthopedic specialist and orthopedic surgeon.  Please advise

## 2022-11-02 NOTE — Telephone Encounter (Signed)
Please schedule 12 month F/U appointment for 90 day refills. Thank you! 

## 2022-11-02 NOTE — Telephone Encounter (Signed)
Referrals for orthopedics and orthopedic surgery placed    [name], MD  [facility]

## 2022-11-02 NOTE — Telephone Encounter (Signed)
Summary: left side soreness / rx req   The patient has called to request an additional prescription for oxyCODONE-acetaminophen (PERCOCET) 7.5-325 MG tablet [id number]  The patient shares that they are continuing to experience discomfort on their left side  Please contact the patient further when possible      Called pt left message on machine to call back.

## 2022-11-02 NOTE — Telephone Encounter (Signed)
Refill sent for both gabapentin and percocet.   Referrals for orthopedics, orthopedic surgery and neurology have all been submitted for clavicular fracture and weakness with paraesthesias.   Recommend that any worsened pain or symptoms be evaluated in office during visit for physical exam.   [name], MD  [facility]

## 2022-11-02 NOTE — Telephone Encounter (Signed)
   Chief Complaint: Pain, requesting refills on Percocet and Gabapentin. Asking to take Gabapentin more than I x daily. Symptoms: Pain Frequency:  Pertinent Negatives: Patient denies  Disposition: '[]'$ ED /'[]'$ Urgent Care (no appt availability in office) / '[]'$ Appointment(In office/virtual)/ '[]'$  [place] [facility]/ '[]'$ Home Care/ '[]'$ Refused Recommended Disposition /'[]'$ [place] [facility]/ '[x]'$  Follow-up with PCP Additional Notes: Please advise pt.   Answer Assessment - Initial Assessment Questions 1. DRUG NAME: "What medicine do you need to have refilled?"     Percocet, Gabapentin  2. REFILLS REMAINING: "How many refills are remaining?" (Note: The label on the medicine or pill bottle will show how many refills are remaining. If there are no refills remaining, then a renewal may be needed.)     0 3. EXPIRATION DATE: "What is the expiration date?" (Note: The label states when the prescription will expire, and thus can no longer be refilled.)     N/a 4. PRESCRIBING HCP: "Who prescribed it?" Reason: If prescribed by specialist, call should be referred to that group.     Dr. Quentin Cornwall 5. SYMPTOMS: "Do you have any symptoms?"     Pain 6. PREGNANCY: "Is there any chance that you are pregnant?" "When was your last menstrual period?"     No  Protocols used: Medication Refill and Renewal Call-A-AH

## 2022-11-08 ENCOUNTER — Other Ambulatory Visit: Payer: Self-pay | Admitting: Orthopedic Surgery

## 2022-11-08 ENCOUNTER — Other Ambulatory Visit: Payer: Self-pay

## 2022-11-08 DIAGNOSIS — M898X1 Other specified disorders of bone, shoulder: Secondary | ICD-10-CM

## 2022-11-08 DIAGNOSIS — R29898 Other symptoms and signs involving the musculoskeletal system: Secondary | ICD-10-CM

## 2022-11-09 ENCOUNTER — Ambulatory Visit
Admission: RE | Admit: 2022-11-09 | Discharge: 2022-11-09 | Disposition: A | Discharge status: Home / Self Care | Payer: 59 | Source: Ambulatory Visit | Attending: Orthopedic Surgery | Admitting: Orthopedic Surgery

## 2022-11-09 ENCOUNTER — Other Ambulatory Visit: Payer: 59

## 2022-11-09 DIAGNOSIS — M898X1 Other specified disorders of bone, shoulder: Secondary | ICD-10-CM

## 2022-11-09 DIAGNOSIS — I728 Aneurysm of other specified arteries: Secondary | ICD-10-CM | POA: Diagnosis not present

## 2022-11-09 DIAGNOSIS — J90 Pleural effusion, not elsewhere classified: Secondary | ICD-10-CM | POA: Diagnosis not present

## 2022-11-09 DIAGNOSIS — J479 Bronchiectasis, uncomplicated: Secondary | ICD-10-CM | POA: Diagnosis not present

## 2022-11-09 DIAGNOSIS — R29898 Other symptoms and signs involving the musculoskeletal system: Secondary | ICD-10-CM

## 2022-11-09 DIAGNOSIS — J9811 Atelectasis: Secondary | ICD-10-CM | POA: Diagnosis not present

## 2022-11-09 MED ORDER — GADOPICLENOL 0.5 MMOL/ML IV SOLN
7.5000 mL | Freq: Once | INTRAVENOUS | Status: AC | PRN
  Administered 2022-11-09: 7.5 mL via INTRAVENOUS

## 2022-11-15 ENCOUNTER — Other Ambulatory Visit: Payer: Self-pay

## 2022-11-15 DIAGNOSIS — R29898 Other symptoms and signs involving the musculoskeletal system: Secondary | ICD-10-CM | POA: Diagnosis not present

## 2022-11-15 DIAGNOSIS — R200 Anesthesia of skin: Secondary | ICD-10-CM | POA: Diagnosis not present

## 2022-11-15 DIAGNOSIS — R202 Paresthesia of skin: Secondary | ICD-10-CM | POA: Diagnosis not present

## 2022-11-18 ENCOUNTER — Other Ambulatory Visit: Payer: Self-pay

## 2022-11-18 NOTE — Telephone Encounter (Signed)
refill request - Rosucastatin Calcium '20MG'$  TAB, 60 day refill approved with no additional refill until office visit.

## 2022-11-30 DIAGNOSIS — G540 Brachial plexus disorders: Secondary | ICD-10-CM | POA: Diagnosis not present

## 2022-11-30 DIAGNOSIS — R29898 Other symptoms and signs involving the musculoskeletal system: Secondary | ICD-10-CM | POA: Diagnosis not present

## 2022-11-30 DIAGNOSIS — M6281 Muscle weakness (generalized): Secondary | ICD-10-CM | POA: Diagnosis not present

## 2022-12-08 ENCOUNTER — Encounter: Payer: Self-pay | Admitting: Family Medicine

## 2022-12-08 MED ORDER — CEPHALEXIN 500 MG PO CAPS: 500.0000 mg | ORAL_CAPSULE | Freq: Three times a day (TID) | ORAL | 0 refills | Status: AC

## 2022-12-29 ENCOUNTER — Encounter: Payer: Self-pay | Admitting: Obstetrics and Gynecology

## 2023-01-03 ENCOUNTER — Other Ambulatory Visit: Payer: Self-pay | Admitting: Obstetrics and Gynecology

## 2023-01-03 DIAGNOSIS — F419 Anxiety disorder, unspecified: Secondary | ICD-10-CM

## 2023-01-03 MED ORDER — ALPRAZOLAM 0.5 MG PO TABS: 0.5000 mg | ORAL_TABLET | Freq: Two times a day (BID) | ORAL | 0 refills | Status: DC | PRN

## 2023-01-03 NOTE — Progress Notes (Signed)
Rx RF xanax till annual. Takes sparingly

## 2023-01-06 ENCOUNTER — Other Ambulatory Visit: Payer: Self-pay | Admitting: Cardiovascular Disease
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
## 2023-01-10 ENCOUNTER — Ambulatory Visit: Payer: 59 | Admitting: Cardiovascular Disease

## 2023-01-10 DIAGNOSIS — M7989 Other specified soft tissue disorders: Secondary | ICD-10-CM

## 2023-01-10 DIAGNOSIS — E7800 Pure hypercholesterolemia, unspecified: Secondary | ICD-10-CM

## 2023-01-10 DIAGNOSIS — I10 Essential (primary) hypertension: Secondary | ICD-10-CM

## 2023-01-11 ENCOUNTER — Ambulatory Visit: Payer: 59 | Attending: Cardiovascular Disease | Admitting: Cardiology

## 2023-01-11 ENCOUNTER — Encounter: Payer: Self-pay | Admitting: Cardiology

## 2023-01-11 VITALS — BP 130/72 | HR 91 | Ht 65.0 in | Wt 158.0 lb

## 2023-01-11 DIAGNOSIS — E782 Mixed hyperlipidemia: Secondary | ICD-10-CM

## 2023-01-11 DIAGNOSIS — E7800 Pure hypercholesterolemia, unspecified: Secondary | ICD-10-CM | POA: Diagnosis not present

## 2023-01-11 DIAGNOSIS — I2510 Atherosclerotic heart disease of native coronary artery without angina pectoris: Secondary | ICD-10-CM | POA: Diagnosis not present

## 2023-01-11 DIAGNOSIS — F419 Anxiety disorder, unspecified: Secondary | ICD-10-CM | POA: Diagnosis not present

## 2023-01-11 DIAGNOSIS — I10 Essential (primary) hypertension: Secondary | ICD-10-CM
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
## 2023-01-11 NOTE — Patient Instructions (Signed)
Medication Instructions:  Your physician recommends that you continue on your current medications as directed. Please refer to the Current Medication list given to you today.  *If you need a refill on your cardiac medications before your next appointment, please call your pharmacy*   Lab Work: Hepatic function panel and lipid panel - Please go to the [facility]. You will check in at the front desk to the right as you walk into the atrium. Valet Parking is offered if needed. - No appointment needed. You may go any day between 7 am and 6 pm.  If you have labs (blood work) drawn today and your tests are completely normal, you will receive your results only by: MyChart Message (if you have MyChart) OR A paper copy in the mail If you have any lab test that is abnormal or we need to change your treatment, we will call you to review the results.   Testing/Procedures: No testing ordered  Follow-Up: At [facility], you and your health needs are our priority.  As part of our continuing mission to provide you with exceptional heart care, we have created designated Provider Care Teams.  These Care Teams include your primary Cardiologist (physician) and Advanced Practice Providers (APPs -  Physician Assistants and Nurse Practitioners) who all work together to provide you with the care you need, when you need it.  We recommend signing up for the patient portal called "MyChart".  Sign up information is provided on this After Visit Summary.  MyChart is used to connect with patients for Virtual Visits (Telemedicine).  Patients are able to view lab/test results, encounter notes, upcoming appointments, etc.  Non-urgent messages can be sent to your provider as well.   To learn more about what you can do with MyChart, go to [email or web address].    Your next appointment:   1 year(s)  Provider:   You may see Julien Nordmann, MD or one of the following Advanced Practice Providers on your  designated Care Team:   Nicolasa Ducking, NP Eula Listen, PA-C Cadence Fransico Michael, PA-C Charlsie Quest, NP

## 2023-01-13 ENCOUNTER — Other Ambulatory Visit: Payer: Self-pay | Admitting: Obstetrics and Gynecology

## 2023-01-13 DIAGNOSIS — N951 Menopausal and female climacteric states: Secondary | ICD-10-CM

## 2023-01-13 DIAGNOSIS — F419 Anxiety disorder, unspecified: Secondary | ICD-10-CM

## 2023-01-19 ENCOUNTER — Other Ambulatory Visit: Payer: Self-pay | Admitting: Cardiovascular Disease

## 2023-02-04 ENCOUNTER — Other Ambulatory Visit: Payer: Self-pay | Admitting: Cardiovascular Disease

## 2023-02-06 NOTE — Progress Notes (Unsigned)
PCP: Ronnald Ramp, MD   No chief complaint on file.   HPI:      [name] is a 62 y.o. No obstetric history on file. who LMP was Patient's last menstrual period was 06/16/2016., presents today for her annual examination.  Her menses are absent due to menopause. Irreg bleeding resolved after endocervical leio removed 2017 with Dr. Jean Rosenthal. She does not have PMB. No pelvic pain.   She does have occas vasomotor sx. Sx tolerable with paxil 20 mg use. She does take xanax sporadically for episodic anxiety. Needs Rx RFs.  Sex activity: single partner, contraception - post menopausal status. She does not have vaginal dryness.  Last Pap: 09/25/18 Results were: no abnormalities /neg HPV DNA.  Hx of STDs: none  Last mammogram: 12/09/21 Results were: normal, repeat in 12 months There is no FH of breast cancer. There is a FH of ovarian cancer in her pat aunt. FH colon cancer in both her parents.  Pt is MyRisk neg 2016. The patient does do self-breast exams.  Colonoscopy: colonoscopy 9/20 with Dr. Servando Snare with pre-cancerous polyps. Repeat due after 5 years. FH of colon cancer in mother and father.  Tobacco use: The patient denies current or previous tobacco use. Alcohol use: social drinker  No drug use Exercise: moderately active  She does get adequate calcium and Vitamin D in her diet.  Fasting labs with PCP.  Past Medical History:  Diagnosis Date   Abnormal vaginal bleeding    Anxiety    Cervical leiomyoma 02/2016   removed surg   GERD (gastroesophageal reflux disease)    Hiatal hernia    Hypertension    Renal insufficiency     Past Surgical History:  Procedure Laterality Date   CESAREAN SECTION     COLONOSCOPY  08/04/2006   Dr Lemar Livings   COLONOSCOPY WITH PROPOFOL N/A 06/15/2019   Procedure: COLONOSCOPY WITH Biopsy;  Surgeon: Midge Minium, MD;  Location: [facility];  Service: Endoscopy;  Laterality: N/A;   DILATATION & CURETTAGE/HYSTEROSCOPY WITH MYOSURE  N/A 03/05/2016   Procedure: DILATATION & CURETTAGE/HYSTEROSCOPY WITH MYOSURE;  Surgeon: Conard Novak, MD;  Location: [facility];  Service: Gynecology;  Laterality: N/A;   DILATION AND CURETTAGE OF UTERUS     POLYPECTOMY N/A 06/15/2019   Procedure: POLYPECTOMY;  Surgeon: Midge Minium, MD;  Location: [facility];  Service: Endoscopy;  Laterality: N/A;  Clip x1  placed as site of Ascending Colon Polyp Removal   TUBAL LIGATION     UPPER GI ENDOSCOPY  08/04/2006   Dr Lemar Livings    Family History  Problem Relation Age of Onset   Hypertension Father    Heart disease Father        CABG x 3   Heart attack Father 52   Colon cancer Father    Liver cancer Father    Hypertension Sister    Hyperlipidemia Sister    Skin cancer Sister        43s   Hypertension Brother    Hypertension Sister    Hyperlipidemia Sister    Hypertension Mother    Colon cancer Mother 14       with mets.   Heart Problems Maternal Grandfather    Colon cancer Paternal Grandfather    Ovarian cancer Paternal Aunt 51   Breast cancer Neg Hx     Social History   Socioeconomic History   Marital status: Married    Spouse name: Not on file   Number  of children: Not on file   Years of education: Not on file   Highest education level: Not on file  Occupational History   Not on file  Tobacco Use   Smoking status: Former    Packs/day: 1.00    Years: 3.00    Additional pack years: 0.00    Total pack years: 3.00    Types: Cigarettes    Quit date: 05/01/1985    Years since quitting: 37.7   Smokeless tobacco: Never  Vaping Use   Vaping Use: Never used  Substance and Sexual Activity   Alcohol use: Yes    Alcohol/week: 14.0 standard drinks of alcohol    Types: 14 Glasses of wine per week   Drug use: No   Sexual activity: Yes    Birth control/protection: Post-menopausal  Other Topics Concern   Not on file  Social History Narrative   Not on file   Social Determinants of Health   Financial Resource  Strain: Not on file  Food Insecurity: Not on file  Transportation Needs: Not on file  Physical Activity: Not on file  Stress: Not on file  Social Connections: Not on file  Intimate Partner Violence: Not on file    No outpatient medications have been marked as taking for the 02/07/23 encounter (Appointment) with Taedyn Glasscock, Ilona Sorrel, PA-C.      ROS:  Review of Systems  Constitutional:  Negative for fatigue, fever and unexpected weight change.  Respiratory:  Negative for cough, shortness of breath and wheezing.   Cardiovascular:  Negative for chest pain, palpitations and leg swelling.  Gastrointestinal:  Negative for blood in stool, constipation, diarrhea, nausea and vomiting.  Endocrine: Negative for cold intolerance, heat intolerance and polyuria.  Genitourinary:  Negative for dyspareunia, dysuria, flank pain, frequency, genital sores, hematuria, menstrual problem, pelvic pain, urgency, vaginal bleeding, vaginal discharge and vaginal pain.  Musculoskeletal:  Negative for back pain, joint swelling and myalgias.  Skin:  Negative for rash.  Neurological:  Negative for dizziness, syncope, light-headedness, numbness and headaches.  Hematological:  Negative for adenopathy.  Psychiatric/Behavioral:  Negative for agitation, confusion, sleep disturbance and suicidal ideas. The patient is not nervous/anxious.      Objective: LMP 06/16/2016    Physical Exam Constitutional:      Appearance: She is well-developed.  Genitourinary:     Vulva normal.     Right Labia: No rash, tenderness or lesions.    Left Labia: No tenderness, lesions or rash.    No vaginal discharge, erythema or tenderness.      Right Adnexa: not tender and no mass present.    Left Adnexa: not tender and no mass present.    No cervical motion tenderness, friability or polyp.     Uterus is not enlarged or tender.  Breasts:    Right: No mass, nipple discharge, skin change or tenderness.     Left: No mass, nipple  discharge, skin change or tenderness.  Neck:     Thyroid: No thyromegaly.  Cardiovascular:     Rate and Rhythm: Normal rate and regular rhythm.     Heart sounds: Normal heart sounds. No murmur heard. Pulmonary:     Effort: Pulmonary effort is normal.     Breath sounds: Normal breath sounds.  Abdominal:     Palpations: Abdomen is soft.     Tenderness: There is no abdominal tenderness. There is no guarding or rebound.  Musculoskeletal:        General: Normal range of motion.  Cervical back: Normal range of motion.  Lymphadenopathy:     Cervical: No cervical adenopathy.  Neurological:     General: No focal deficit present.     Mental Status: She is alert and oriented to person, place, and time.     Cranial Nerves: No cranial nerve deficit.  Skin:    General: Skin is warm and dry.  Psychiatric:        Mood and Affect: Mood normal.        Behavior: Behavior normal.        Thought Content: Thought content normal.        Judgment: Judgment normal.  Vitals reviewed.     Assessment/Plan:  Encounter for annual routine gynecological examination  Encounter for screening mammogram for malignant neoplasm of breast - Plan: MM DIAG BREAST TOMO BILATERAL; pt to schedule mammo  Breast calcification seen on mammogram - Plan: MM DIAG BREAST TOMO BILATERAL  Vasomotor symptoms due to menopause - Plan: PARoxetine (PAXIL) 20 MG tablet; Rx RF. Doing well.   Anxiety - Plan: ALPRAZolam (XANAX) 0.5 MG tablet, PARoxetine (PAXIL) 20 MG tablet; Rx RF. Takes sparingly.    No orders of the defined types were placed in this encounter.           GYN counsel breast self exam, mammography screening, menopause, adequate intake of calcium and vitamin D, diet and exercise    F/U  No follow-ups on file.  Yahel Fuston B. Sena Hoopingarner, PA-C 02/06/2023 6:56 PM

## 2023-02-07 ENCOUNTER — Ambulatory Visit (INDEPENDENT_AMBULATORY_CARE_PROVIDER_SITE_OTHER): Payer: 59 | Admitting: Obstetrics and Gynecology

## 2023-02-07 ENCOUNTER — Other Ambulatory Visit
Admission: RE | Admit: 2023-02-07 | Discharge: 2023-02-07 | Disposition: A | Discharge status: Home / Self Care | Payer: 59 | Source: Ambulatory Visit | Attending: Cardiovascular Disease | Admitting: Cardiovascular Disease

## 2023-02-07 ENCOUNTER — Other Ambulatory Visit (HOSPITAL_COMMUNITY)
Admission: RE | Admit: 2023-02-07 | Discharge: 2023-02-07 | Disposition: A | Discharge status: Home / Self Care | Payer: 59 | Source: Ambulatory Visit | Attending: Obstetrics and Gynecology | Admitting: Obstetrics and Gynecology

## 2023-02-07 ENCOUNTER — Encounter: Payer: Self-pay | Admitting: Obstetrics and Gynecology

## 2023-02-07 VITALS — BP 124/80 | Ht 63.5 in | Wt 159.0 lb

## 2023-02-07 DIAGNOSIS — N951 Menopausal and female climacteric states: Secondary | ICD-10-CM

## 2023-02-07 DIAGNOSIS — E7800 Pure hypercholesterolemia, unspecified: Secondary | ICD-10-CM | POA: Insufficient documentation

## 2023-02-07 DIAGNOSIS — Z1231 Encounter for screening mammogram for malignant neoplasm of breast: Secondary | ICD-10-CM

## 2023-02-07 DIAGNOSIS — Z1151 Encounter for screening for human papillomavirus (HPV): Secondary | ICD-10-CM

## 2023-02-07 DIAGNOSIS — Z01419 Encounter for gynecological examination (general) (routine) without abnormal findings: Secondary | ICD-10-CM

## 2023-02-07 DIAGNOSIS — Z124 Encounter for screening for malignant neoplasm of cervix: Secondary | ICD-10-CM

## 2023-02-07 DIAGNOSIS — Z13820 Encounter for screening for osteoporosis: Secondary | ICD-10-CM

## 2023-02-07 DIAGNOSIS — E2839 Other primary ovarian failure: Secondary | ICD-10-CM

## 2023-02-07 DIAGNOSIS — F419 Anxiety disorder, unspecified: Secondary | ICD-10-CM

## 2023-02-07 LAB — LIPID PANEL
Cholesterol: 145 mg/dL (ref 0–200)
HDL: 75 mg/dL (ref >40)
LDL Cholesterol: 57 mg/dL (ref 0–99)
Total CHOL/HDL Ratio: 1.9 RATIO
Triglycerides: 66 mg/dL (ref <150)
VLDL: 13 mg/dL (ref 0–40)

## 2023-02-07 LAB — HEPATIC FUNCTION PANEL
ALT: 28 U/L (ref 0–44)
AST: 28 U/L (ref 15–41)
Albumin: 5 g/dL (ref 3.5–5.0)
Alkaline Phosphatase: 105 U/L (ref 38–126)
Bilirubin, Direct: 0.1 mg/dL (ref 0.0–0.2)
Indirect Bilirubin: 0.7 mg/dL (ref 0.3–0.9)
Total Bilirubin: 0.8 mg/dL (ref 0.3–1.2)
Total Protein: 8.4 g/dL — ABNORMAL HIGH (ref 6.5–8.1)

## 2023-02-07 MED ORDER — PAROXETINE HCL 20 MG PO TABS: ORAL_TABLET | ORAL | 3 refills | Status: DC

## 2023-02-07 MED ORDER — ALPRAZOLAM 0.5 MG PO TABS
0.5000 mg | ORAL_TABLET | Freq: Two times a day (BID) | ORAL | 0 refills | Status: DC | PRN
Start: 2023-02-07 — End: 2023-09-07

## 2023-02-07 NOTE — Patient Instructions (Addendum)
I value your feedback and you entrusting us with your care. If you get a [facility] patient survey, I would appreciate you taking the time to let us know about your experience today. Thank you!  [facility]: [phone]      

## 2023-02-08 LAB — CYTOLOGY - PAP
Comment: NEGATIVE
Diagnosis: NEGATIVE
High risk HPV: NEGATIVE

## 2023-03-22 ENCOUNTER — Other Ambulatory Visit: Payer: Self-pay | Admitting: Obstetrics and Gynecology

## 2023-03-22 DIAGNOSIS — F419 Anxiety disorder, unspecified: Secondary | ICD-10-CM

## 2023-03-30 ENCOUNTER — Ambulatory Visit
Admission: RE | Admit: 2023-03-30 | Discharge: 2023-03-30 | Disposition: A | Discharge status: Home / Self Care | Payer: 59 | Source: Ambulatory Visit | Attending: Obstetrics and Gynecology | Admitting: Obstetrics and Gynecology

## 2023-03-30 DIAGNOSIS — E2839 Other primary ovarian failure: Secondary | ICD-10-CM | POA: Diagnosis not present

## 2023-03-30 DIAGNOSIS — Z1231 Encounter for screening mammogram for malignant neoplasm of breast: Secondary | ICD-10-CM | POA: Insufficient documentation

## 2023-03-30 DIAGNOSIS — Z13820 Encounter for screening for osteoporosis: Secondary | ICD-10-CM

## 2023-03-30 DIAGNOSIS — Z780 Asymptomatic menopausal state: Secondary | ICD-10-CM | POA: Diagnosis not present

## 2023-04-03 ENCOUNTER — Other Ambulatory Visit: Payer: Self-pay | Admitting: Obstetrics and Gynecology

## 2023-04-03 MED ORDER — HYDROXYZINE HCL 25 MG PO TABS: 25.0000 mg | ORAL_TABLET | Freq: Four times a day (QID) | ORAL | 0 refills | Status: DC | PRN

## 2023-04-03 NOTE — Progress Notes (Signed)
Rx atarax prn anxiety. Trying this instead of xanax

## 2023-05-27 ENCOUNTER — Other Ambulatory Visit: Payer: Self-pay | Admitting: Cardiovascular Disease

## 2023-05-27 ENCOUNTER — Other Ambulatory Visit: Payer: Self-pay | Admitting: Obstetrics and Gynecology

## 2023-05-27 MED ORDER — EZETIMIBE 10 MG PO TABS: 10.0000 mg | ORAL_TABLET | Freq: Every day | ORAL | 3 refills | Status: DC

## 2023-05-27 NOTE — Telephone Encounter (Signed)
Refill sent to pharmacy as requested

## 2023-06-03 MED ORDER — HYDROXYZINE HCL 25 MG PO TABS: 25.0000 mg | ORAL_TABLET | Freq: Four times a day (QID) | ORAL | 5 refills | Status: AC | PRN

## 2023-08-06 ENCOUNTER — Encounter: Payer: Self-pay | Admitting: Family Medicine

## 2023-08-08 ENCOUNTER — Telehealth: Payer: Self-pay | Admitting: Family Medicine

## 2023-08-08 NOTE — Telephone Encounter (Signed)
I called patient and recommended ER visit per Dr. Roxan Hockey note. Patient advised that she no longer has abdominal pain or black stools.

## 2023-08-08 NOTE — Telephone Encounter (Signed)
Reviewed patient call note concerning for melena and abdominal pain.   Please advise patient to be evaluated in the ED prior to leaving for trip for concerns for GI bleed.   Ronnald Ramp, MD

## 2023-08-31 ENCOUNTER — Encounter: Payer: Self-pay | Admitting: Family Medicine

## 2023-09-01 ENCOUNTER — Encounter: Payer: Self-pay | Admitting: Physician Assistant

## 2023-09-01 ENCOUNTER — Ambulatory Visit (INDEPENDENT_AMBULATORY_CARE_PROVIDER_SITE_OTHER): Payer: 59 | Admitting: Physician Assistant

## 2023-09-01 VITALS — BP 132/70 | HR 91 | Resp 16 | Ht 64.0 in | Wt 163.0 lb

## 2023-09-01 DIAGNOSIS — R300 Dysuria: Secondary | ICD-10-CM | POA: Diagnosis not present

## 2023-09-01 DIAGNOSIS — N3001 Acute cystitis with hematuria: Secondary | ICD-10-CM

## 2023-09-01 LAB — POCT URINALYSIS DIPSTICK
Appearance: NORMAL
Bilirubin, UA: NEGATIVE
Glucose, UA: NEGATIVE
Ketones, UA: NEGATIVE
Nitrite, UA: POSITIVE — AB
Protein, UA: NEGATIVE
Spec Grav, UA: 1.015 (ref 1.010–1.025)
Urobilinogen, UA: 0.2 U/dL
pH, UA: 6.5 (ref 5.0–8.0)

## 2023-09-01 MED ORDER — PHENAZOPYRIDINE HCL 100 MG PO TABS
100.0000 mg | ORAL_TABLET | Freq: Three times a day (TID) | ORAL | 0 refills | Status: DC | PRN
Start: 2023-09-01 — End: 2024-04-23

## 2023-09-01 MED ORDER — NITROFURANTOIN MONOHYD MACRO 100 MG PO CAPS
100.0000 mg | ORAL_CAPSULE | Freq: Two times a day (BID) | ORAL | 0 refills | Status: AC
Start: 2023-09-01 — End: 2023-09-06

## 2023-09-01 NOTE — Progress Notes (Signed)
Acute Office Visit   Patient: Mary Christian   DOB: 1960/11/23   62 y.o. Female  MRN: [id number] Visit Date: 09/01/2023  Today's healthcare provider: Oswaldo Conroy Nichole Neyer, PA-C  Introduced myself to the patient as a Secondary school teacher and provided education on APPs in clinical practice.    Chief Complaint  Patient presents with   Dysuria    X1 day. Took Azo   Subjective    HPI HPI     Dysuria    Additional comments: X1 day. Took Azo      Last edited by Dollene Primrose, CMA on 09/01/2023  8:37 AM.      Concern for UTI   She reports symptoms started about 24 hours ago She started having dysuria, urinary hesitancy and urgency   She denies trouble urinating but admits to intense pain with urination She has taken AZO to assist with dysuria   Medications: Outpatient Medications Prior to Visit  Medication Sig   ALPRAZolam (XANAX) 0.5 MG tablet Take 1 tablet (0.5 mg total) by mouth 2 (two) times daily as needed for anxiety.   amLODipine (NORVASC) 10 MG tablet TAKE ONE TABLET BY MOUTH EVERY DAY   CALCIUM CITRATE-VITAMIN D PO Take 630 mg by mouth daily.   ezetimibe (ZETIA) 10 MG tablet Take 1 tablet (10 mg total) by mouth daily.   hydrOXYzine (ATARAX) 25 MG tablet Take 1 tablet (25 mg total) by mouth every 6 (six) hours as needed for itching.   losartan (COZAAR) 100 MG tablet TAKE 1 TABLET BY MOUTH DAILY   PARoxetine (PAXIL) 20 MG tablet TAKE ONE TABLET (20 MG) BY MOUTH EVERY DAY   rosuvastatin (CRESTOR) 20 MG tablet Take 1 tablet (20 mg total) by mouth daily.   No facility-administered medications prior to visit.    Review of Systems  Constitutional:  Negative for chills and fever.  Gastrointestinal:  Positive for nausea. Negative for abdominal pain and vomiting.  Genitourinary:  Positive for dysuria, frequency and urgency. Negative for decreased urine volume, difficulty urinating, flank pain, hematuria, pelvic pain, vaginal bleeding, vaginal discharge and vaginal pain.         Objective    BP 132/70   Pulse 91   Resp 16   Ht 5\' 4"  (1.626 m)   Wt 163 lb (73.9 kg)   LMP 06/16/2016   SpO2 97%   BMI 27.98 kg/m     Physical Exam Vitals reviewed.  Constitutional:      General: She is awake.     Appearance: Normal appearance. She is well-developed and well-groomed.  HENT:     Head: Normocephalic and atraumatic.  Eyes:     General: Lids are normal. Gaze aligned appropriately.     Extraocular Movements: Extraocular movements intact.     Conjunctiva/sclera: Conjunctivae normal.  Pulmonary:     Effort: Pulmonary effort is normal.  Neurological:     General: No focal deficit present.     Mental Status: She is alert and oriented to person, place, and time.     GCS: GCS eye subscore is 4. GCS verbal subscore is 5. GCS motor subscore is 6.     Cranial Nerves: No cranial nerve deficit, dysarthria or facial asymmetry.  Psychiatric:        Attention and Perception: Attention and perception normal.        Mood and Affect: Mood and affect normal.        Speech: Speech normal.  Behavior: Behavior normal. Behavior is cooperative.       Results for orders placed or performed in visit on 09/01/23  POCT Urinalysis Dipstick  Result Value Ref Range   Color, UA Yellow    Clarity, UA Clear    Glucose, UA Negative Negative   Bilirubin, UA Negative    Ketones, UA Negative    Spec Grav, UA 1.015 1.010 - 1.025   Blood, UA Large (A)    pH, UA 6.5 5.0 - 8.0   Protein, UA Negative Negative   Urobilinogen, UA 0.2 0.2 or 1.0 E.U./dL   Nitrite, UA Positive (A)    Leukocytes, UA Small (1+) (A) Negative   Appearance Normal    Odor None     Assessment & Plan      No follow-ups on file.     Problem List Items Addressed This Visit   None Visit Diagnoses       Acute cystitis with hematuria    -  Primary   Relevant Medications   nitrofurantoin, macrocrystal-monohydrate, (MACROBID) 100 MG capsule   phenazopyridine (PYRIDIUM) 100 MG tablet     Dysuria        Relevant Orders   POCT Urinalysis Dipstick (Completed)   Urine Culture      Acute, new problem Patient reports symptoms comprised of the following: dysuria, pressure, incomplete voiding, increased urinary frequency, urinary urgency for the past 24 hours  Results of UA are consistent with UTI - urine sample sent for culture to determine causative organism and susceptibility- results to dictate further management  Urine dip results reviewed with patient during apt Will provide script  for Macrobid 100 mg PO BID x 5 days - discussed importance of finishing entire course of abx and staying well hydrated while recovering from UTI Will also provide script for Pyridium 100 mg PO TID PRN Reviewed ED precautions with patient Follow up as needed for persistent or worsening symptoms   No follow-ups on file.   I, Tedd Cottrill E Zakarie Sturdivant, PA-C, have reviewed all documentation for this visit. The documentation on 09/01/23 for the exam, diagnosis, procedures, and orders are all accurate and complete.   Jacquelin Hawking, MHS, PA-C [facility]

## 2023-09-01 NOTE — Patient Instructions (Signed)
Based on your symptoms and results of the urinalysis I believe you have a UTI I recommend the following:  I have sent in a script for Macrobid 100 mg to be taken by mouth twice per day for 5 days   Please finish the entire course of the antibiotic even if you are feeling better before it is completed. Stay well hydrated (at least 75 oz of water per day) and avoid holding your urine If you have any of the following please let us know: persistent symptoms, fever, trouble urinating or inability to urinate, confusion, flank pain.

## 2023-09-04 LAB — URINE CULTURE
MICRO NUMBER:: 15844175
SPECIMEN QUALITY:: ADEQUATE

## 2023-09-05 NOTE — Progress Notes (Signed)
Your urine culture results are back.  It appears that you had a UTI caused by E. coli.  This bacteria was susceptible to the Macrobid that was started at your appointment.  Please make sure that you finish the entire course of medication as directed.  Please let us know if you have further questions or concerns

## 2023-09-07 ENCOUNTER — Other Ambulatory Visit: Payer: Self-pay | Admitting: Obstetrics and Gynecology

## 2023-09-07 DIAGNOSIS — F419 Anxiety disorder, unspecified: Secondary | ICD-10-CM

## 2023-09-08 MED ORDER — ALPRAZOLAM 0.5 MG PO TABS
0.5000 mg | ORAL_TABLET | Freq: Two times a day (BID) | ORAL | 0 refills | Status: AC | PRN
Start: 2023-09-08 — End: ?

## 2023-10-14 ENCOUNTER — Other Ambulatory Visit: Payer: Self-pay | Admitting: Obstetrics and Gynecology

## 2023-10-14 DIAGNOSIS — F419 Anxiety disorder, unspecified: Secondary | ICD-10-CM

## 2023-11-04 ENCOUNTER — Other Ambulatory Visit: Payer: Self-pay | Admitting: Cardiovascular Disease

## 2024-01-26 ENCOUNTER — Other Ambulatory Visit: Payer: Self-pay | Admitting: Cardiology

## 2024-02-06 IMAGING — MG DIGITAL DIAGNOSTIC BILAT W/ TOMO W/ CAD
8 of 12 series · 8 of 28 positions shown · non-contrast
Comparison: Previous exam(s).

CLINICAL DATA: 61-year-old female presenting for annual bilateral
mammogram and final follow-up of 2 groups of probably benign right
breast calcifications.

EXAM:
DIGITAL DIAGNOSTIC BILATERAL MAMMOGRAM WITH TOMOSYNTHESIS AND CAD
TECHNIQUE: Bilateral digital diagnostic mammography and breast tomosynthesis
was performed. The images were evaluated with computer-aided
detection.

[R ML (1 of 2)]
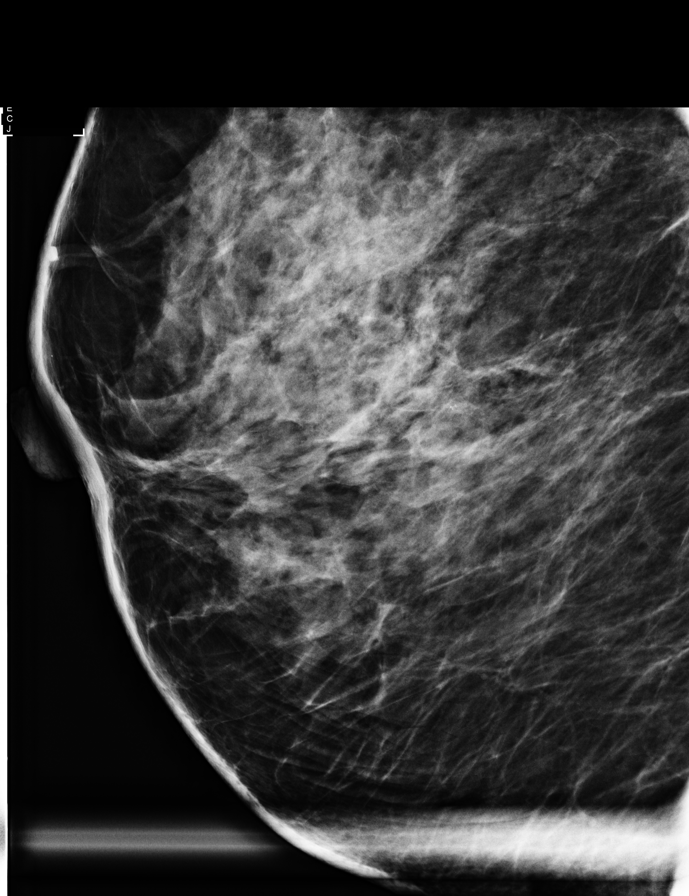

[R CC (1 of 2)]
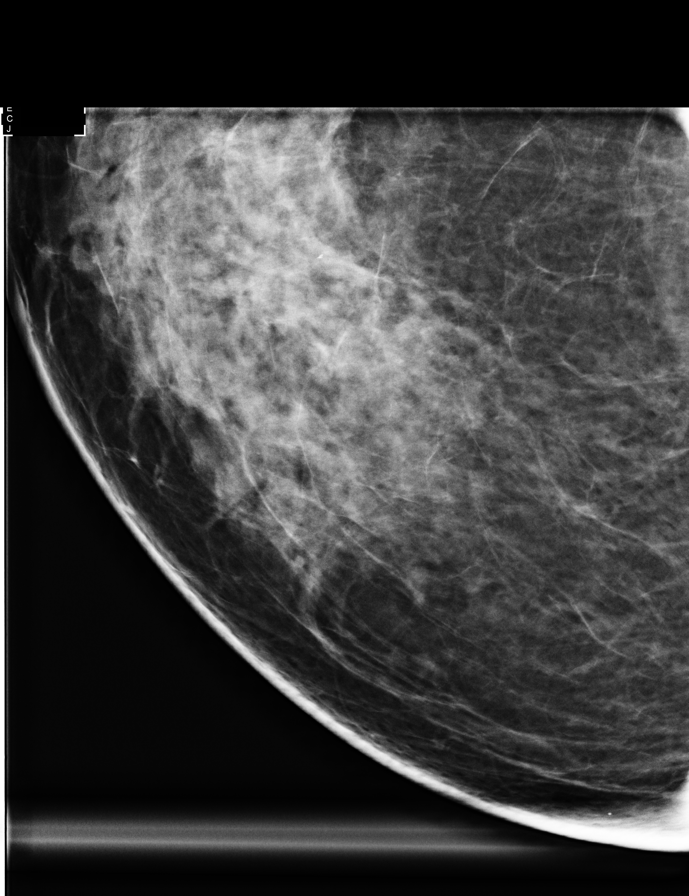

[R CC (2 of 2)]
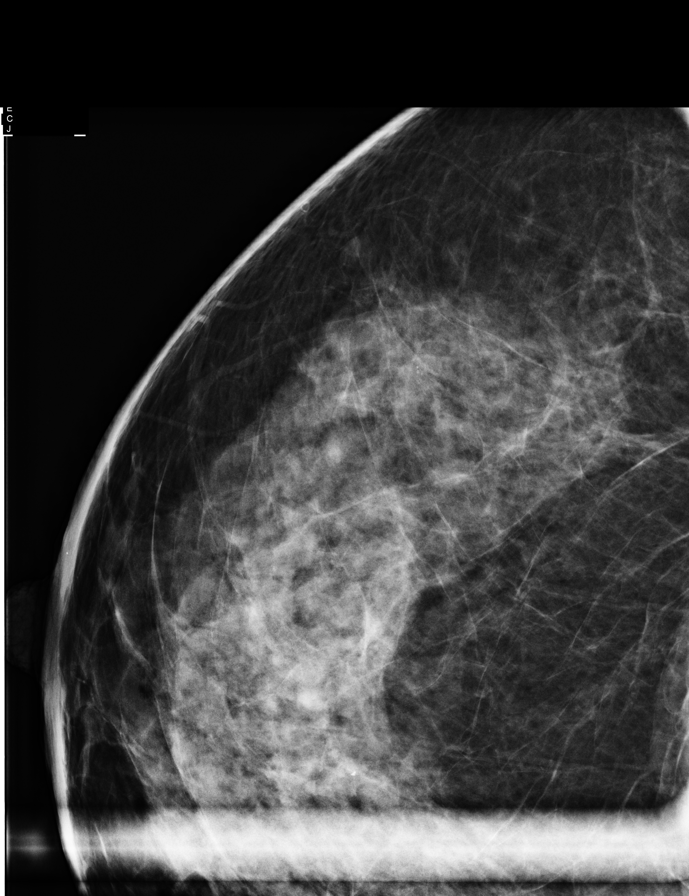

[R ML (2 of 2)]
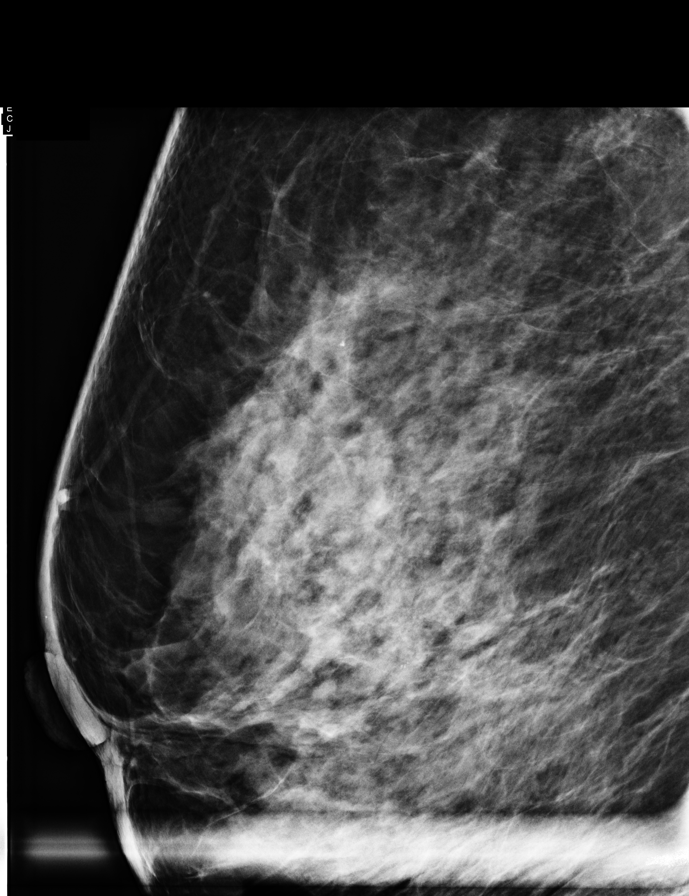

[R MLO synth-2D]
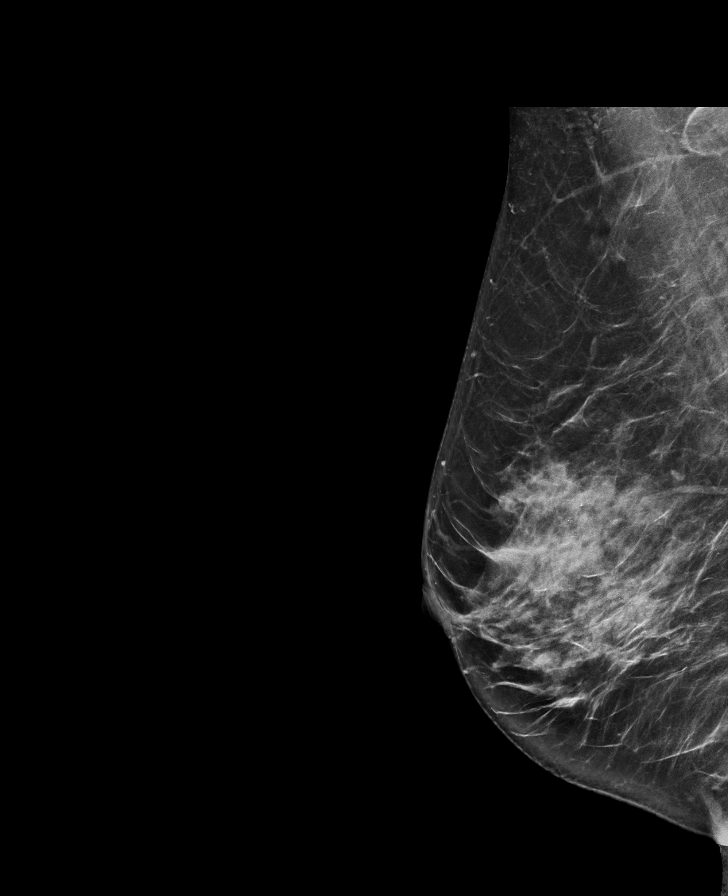

[L MLO synth-2D]
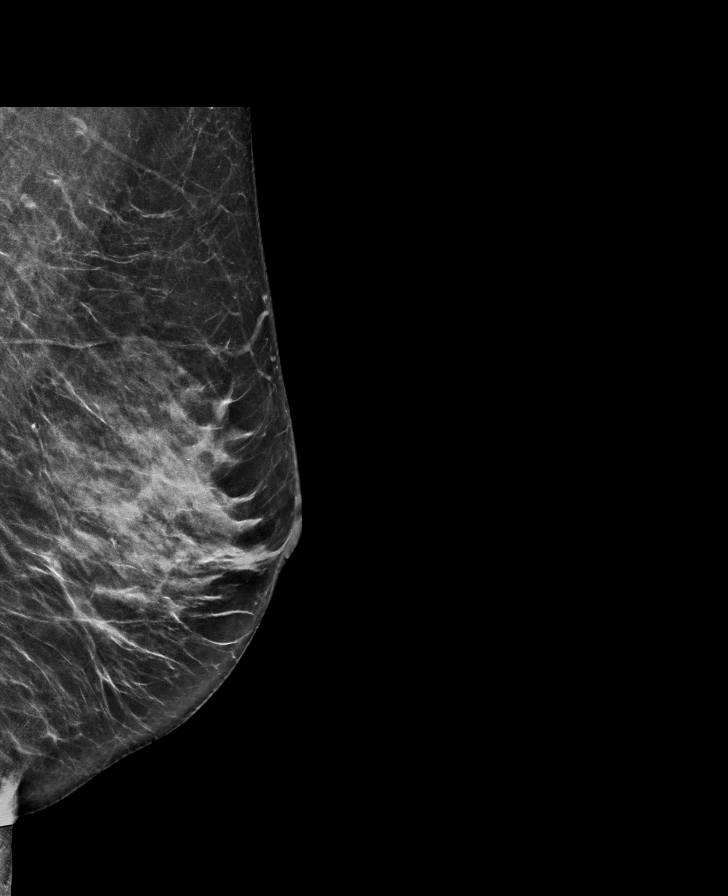

[R CC synth-2D]
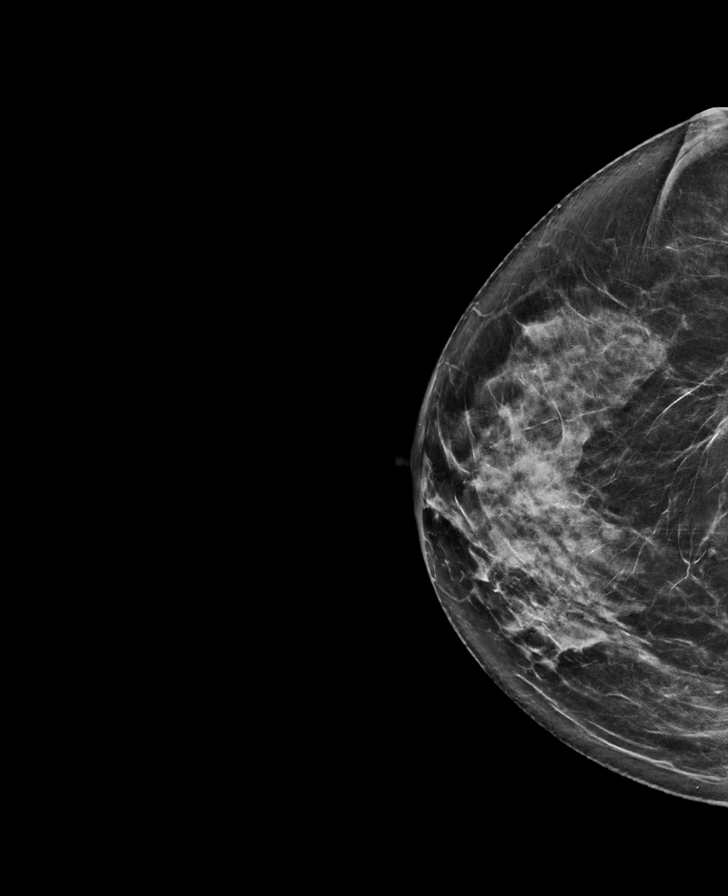

[L CC synth-2D]
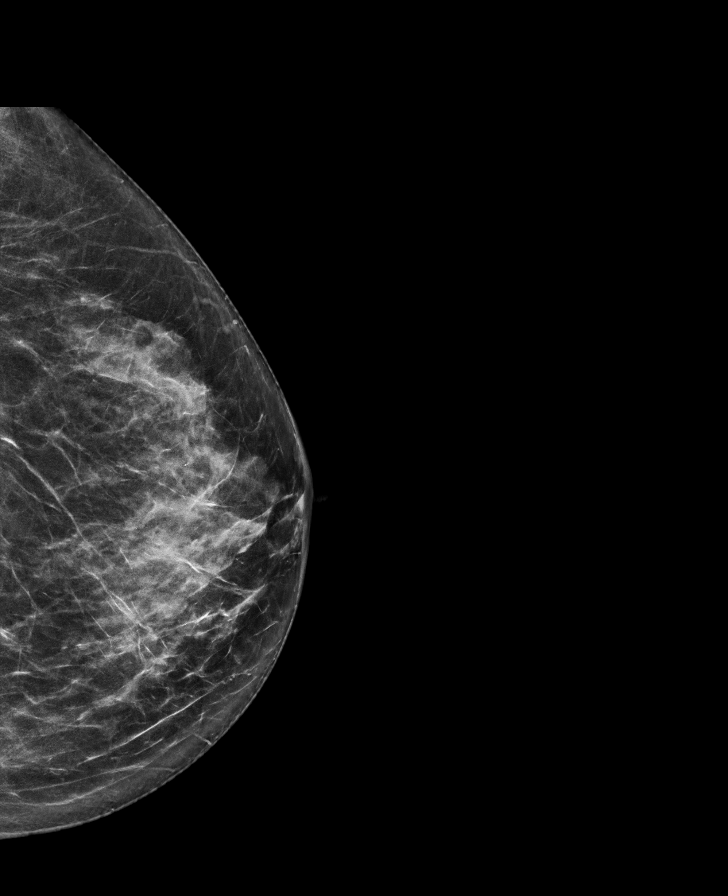

[8 of 28 positions shown; findings below may reference images not displayed]

ACR Breast Density Category c: The breast tissue is heterogeneously
dense, which may obscure small masses.
FINDINGS: Two groups of calcifications within the right breast are
mammographically stable. Otherwise, no new or suspicious findings in
either breast.
IMPRESSION: 1. No mammographic evidence of malignancy in either breast.
2. Benign right breast calcifications demonstrating greater than 2
year stability. No further follow-up required.

RECOMMENDATION:
Screening mammogram in one year.(Code:U7-6-RQD)

I have discussed the findings and recommendations with the patient.
If applicable, a reminder letter will be sent to the patient
regarding the next appointment.

BI-RADS CATEGORY  2: Benign.

## 2024-02-13 ENCOUNTER — Other Ambulatory Visit: Payer: Self-pay | Admitting: Obstetrics and Gynecology

## 2024-02-13 ENCOUNTER — Other Ambulatory Visit: Payer: Self-pay | Admitting: Cardiovascular Disease

## 2024-02-13 DIAGNOSIS — N951 Menopausal and female climacteric states: Secondary | ICD-10-CM

## 2024-02-13 DIAGNOSIS — F419 Anxiety disorder, unspecified: Secondary | ICD-10-CM

## 2024-02-18 ENCOUNTER — Other Ambulatory Visit: Payer: Self-pay | Admitting: Obstetrics and Gynecology

## 2024-02-18 DIAGNOSIS — F419 Anxiety disorder, unspecified: Secondary | ICD-10-CM

## 2024-02-18 DIAGNOSIS — N951 Menopausal and female climacteric states: Secondary | ICD-10-CM

## 2024-02-19 ENCOUNTER — Encounter: Payer: Self-pay | Admitting: Obstetrics and Gynecology

## 2024-02-20 ENCOUNTER — Other Ambulatory Visit: Payer: Self-pay | Admitting: Obstetrics and Gynecology

## 2024-02-20 DIAGNOSIS — F419 Anxiety disorder, unspecified: Secondary | ICD-10-CM

## 2024-02-20 DIAGNOSIS — N951 Menopausal and female climacteric states: Secondary | ICD-10-CM

## 2024-02-20 MED ORDER — PAROXETINE HCL 20 MG PO TABS: 20.0000 mg | ORAL_TABLET | Freq: Every day | ORAL | 0 refills | Status: DC

## 2024-02-24 ENCOUNTER — Encounter: Payer: Self-pay | Admitting: Physician Assistant

## 2024-03-07 ENCOUNTER — Ambulatory Visit: Attending: Cardiology | Admitting: Physician Assistant

## 2024-03-07 ENCOUNTER — Encounter: Payer: Self-pay | Admitting: Cardiology

## 2024-03-07 VITALS — BP 137/85 | HR 98 | Ht 64.0 in | Wt 164.6 lb

## 2024-03-07 DIAGNOSIS — Z79899 Other long term (current) drug therapy: Secondary | ICD-10-CM

## 2024-03-07 DIAGNOSIS — E782 Mixed hyperlipidemia: Secondary | ICD-10-CM | POA: Diagnosis not present

## 2024-03-07 DIAGNOSIS — I2510 Atherosclerotic heart disease of native coronary artery without angina pectoris: Secondary | ICD-10-CM

## 2024-03-07 DIAGNOSIS — I10 Essential (primary) hypertension: Secondary | ICD-10-CM

## 2024-03-07 NOTE — Progress Notes (Signed)
 Cardiology Office Note    Date:  03/07/2024   ID:  Mary Christian, DOB 05-05-1961, MRN [id number]  PCP:  Mimi Alt, MD  Cardiologist:  Belva Boyden, MD  Electrophysiologist:  None   Chief Complaint: 1 year follow-up  History of Present Illness:   Mary Christian is a 63 y.o. female with history of hypertension, anxiety, coronary calcifications noted on CT, prior episode of acute renal failure from dehydration in 2013, UTI, former smoker, peripheral edema, and hyperlipidemia who presents for follow-up on hypertension and coronary calcifications.    Patient follows with Dr. Gollan for management of hypertension.  She was scheduled for CT coronary calcium  scoring for risk stratification due to strong family history.  This was completed 01/2022 and showed calcium  score of 143 which was 93 percentile for age and sex matched controls, CAC 100-99 and LAD.  Patient was seen at [facility] ED 06/08/2022 with complaints of abdominal pain and dyspnea on exertion.  She was treated for CAP with antibiotics and O2.  Presented again to [facility] ED 10/09/2022 with complaints of atypical chest pain.  She was found to have multiple left-sided rib fractures on her CT angiogram.  Patient later admitted she has sustained trauma the evening prior after drinking a significant amount of alcohol and falling out of bed.  CT of the abdomen pelvis showed grade 2 splenic laceration and small hemoperitoneum.  Patient was transferred to [facility] where she underwent successful embolization of the splenic artery with reduction of flow to the spleen, decreased perfusion to the lower and mid pole segments, and resolution of the visualized contrast extravasation on 10/10/2022.  She followed up with orthopedics for close displaced fracture of the shaft of the clavicle with routine healing and close fracture of the scapula.  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  She was referred to occupational therapy.  She has also followed up with neurology.   Patient was most recently seen in clinic 01/11/2023 overall doing well from a cardiac perspective.  No further testing or medication changes were indicated at that time.  Patient is seen in clinic today and overall doing well from a cardiac perspective.  She is without symptoms of angina or cardiac decompensation.  She denies chest pain, shortness of breath, palpitations, lightheadedness, dizziness, and lower extremity swelling.  She reports occasionally taking her blood pressure at home with systolic pressure typically in the 120 to 130 mmHg range.  She reports that her resting heart rate is typically in the 80s bpm.  She reports that she gets nervous about coming to the office which typically makes her blood pressure and heart rate go up.  Labs independently reviewed: 02/07/2023- TC 145, TG 66, HDL 75, LDL 57, LFTs wnl 10/27/2022- BUN 13, Cr 0.99, Na 140, K 4.2 10/13/2022- hgb 8.9, hct 24.5, platelets 190  Objective   Past Medical History:  Diagnosis Date   Abnormal vaginal bleeding    Anxiety    Cervical leiomyoma 02/2016   removed surg   GERD (gastroesophageal reflux disease)    Hiatal hernia    Hypertension    Renal insufficiency     Current Medications: Current Meds  Medication Sig   ALPRAZolam  (XANAX ) 0.5 MG tablet Take 1 tablet (0.5 mg total) by mouth 2 (two) times daily as needed for anxiety.   amLODipine  (NORVASC ) 10 MG  tablet Take 1 tablet (10 mg total) by mouth daily. Office visit needed for future refills   CALCIUM  CITRATE-VITAMIN D PO Take 630 mg by mouth daily.   ezetimibe  (ZETIA ) 10 MG tablet Take 1 tablet (10 mg total) by mouth daily.   hydrOXYzine  (ATARAX ) 25 MG tablet Take 1 tablet (25 mg total) by mouth every 6 (six) hours as needed for itching.   losartan  (COZAAR ) 100 MG tablet  TAKE 1 TABLET BY MOUTH DAILY   PARoxetine  (PAXIL ) 20 MG tablet Take 1 tablet (20 mg total) by mouth daily.   rosuvastatin  (CRESTOR ) 20 MG tablet TAKE 1 TABLET BY MOUTH DAILY    Allergies:   Aspirin   Social History   Socioeconomic History   Marital status: Married    Spouse name: Not on file   Number of children: Not on file   Years of education: Not on file   Highest education level: Not on file  Occupational History   Not on file  Tobacco Use   Smoking status: Former    Current packs/day: 0.00    Average packs/day: 1 pack/day for 3.0 years (3.0 ttl pk-yrs)    Types: Cigarettes    Start date: 05/01/1982    Quit date: 05/01/1985    Years since quitting: 38.8   Smokeless tobacco: Never  Vaping Use   Vaping status: Never Used  Substance and Sexual Activity   Alcohol use: Yes    Alcohol/week: 14.0 standard drinks of alcohol    Types: 14 Glasses of wine per week   Drug use: No   Sexual activity: Yes    Birth control/protection: Post-menopausal  Other Topics Concern   Not on file  Social History Narrative   Not on file   Social Drivers of Health   Financial Resource Strain: Low Risk  (11/30/2022)   Received from [facility]   Overall Financial Resource Strain (CARDIA)    Difficulty of Paying Living Expenses: Not hard at all  Food Insecurity: No Food Insecurity (11/30/2022)   Received from [facility]   Hunger Vital Sign    Within the past 12 months, you worried that your food would run out before you got the money to buy more.: Never true    Within the past 12 months, the food you bought just didn't last and you didn't have money to get more.: Never true  Transportation Needs: No Transportation Needs (11/30/2022)   Received from [facility] - Transportation    In the past 12 months, has lack of transportation kept you from medical appointments or from getting medications?: No    Lack of Transportation  (Non-Medical): No  Physical Activity: Not on file  Stress: Not on file  Social Connections: Not on file     Family History:  The patient's family history includes Colon cancer in her father and paternal grandfather; Colon cancer (age of onset: 8) in her mother; Heart Problems in her maternal grandfather; Heart attack (age of onset: 16) in her father; Heart disease in her father; Hyperlipidemia in her sister and sister; Hypertension in her brother, father, mother, sister, and sister; Liver cancer in her father; Ovarian cancer (age of onset: 69) in her paternal aunt; Skin cancer in her sister. There is no history of Breast cancer.  ROS:   12-point review of systems is negative unless otherwise noted in the HPI.   EKGs/Other Studies Reviewed:    Studies reviewed were summarized above. The  additional studies were reviewed today:  01/20/2022 Calcium  score 1. Coronary calcium  score of 143. This was 93rd percentile for age and sex matched control. 2. CAC 100-299 in LAD.  CAC-DRS A2/N1. 3. Continue heart healthy lifestyle and risk factor modification. 4. Recommend aspirin and statin if no contraindications.  EKG:  EKG personally reviewed by me today EKG Interpretation Date/Time:  Wednesday March 07 2024 13:34:59 EDT Ventricular Rate:  98 PR Interval:  126 QRS Duration:  74 QT Interval:  340 QTC Calculation: 434 R Axis:   -4  Text Interpretation: Normal sinus rhythm Confirmed by Gildardo Labrador ([id number]) on 03/07/2024 1:42:09 PM  PHYSICAL EXAM:    VS:  BP 137/85   Pulse 98   Ht 5' 4 (1.626 m)   Wt 164 lb 9.6 oz (74.7 kg)   LMP 06/16/2016   SpO2 96%   BMI 28.25 kg/m   BMI: Body mass index is 28.25 kg/m.  Physical Exam Vitals and nursing note reviewed.  Constitutional:      General: She is not in acute distress.    Appearance: Normal appearance.   Cardiovascular:     Rate and Rhythm: Normal rate and regular rhythm.     Heart sounds: No murmur heard. Pulmonary:     Effort:  Pulmonary effort is normal. No respiratory distress.     Breath sounds: No wheezing or rales.   Musculoskeletal:     Right lower leg: No edema.     Left lower leg: No edema.   Skin:    General: Skin is warm and dry.   Neurological:     General: No focal deficit present.     Mental Status: She is alert and oriented to person, place, and time. Mental status is at baseline.   Psychiatric:        Mood and Affect: Mood normal.        Behavior: Behavior normal.     Wt Readings from Last 3 Encounters:  03/07/24 164 lb 9.6 oz (74.7 kg)  09/01/23 163 lb (73.9 kg)  02/07/23 159 lb (72.1 kg)       ASSESSMENT & PLAN:   Hypertension - Blood pressure mildly elevated in the office today.  Patient reports lower readings at home.  Recommended taking blood pressure approximately 1 hour after medications in the morning and keeping a log for the next 1 to 2 weeks to report back to our office.  Continue amlodipine  10 mg daily and losartan  100 mg daily.  Could consider addition of beta-blocker for further blood pressure management given elevated resting heart rate.  Mixed hyperlipidemia - Most recent lipid panel 01/2023 with LDL 57.  Goal less than 70.  Will recheck lipid panel in LFTs today.  Continue ezetimibe  and rosuvastatin .  Coronary calcification  - Prior coronary CTA 01/2022 with calcium  score of 143. CT angio of chest 09/2022 showed coronary artery calcification.  Patient is without symptoms of angina or cardiac decompensation.  No further ischemic evaluation indicated at this time.  Continue aspirin and statin.   Disposition: F/u with Dr. Gollan or an APP in 1 year.   Medication Adjustments/Labs and Tests Ordered: Current medicines are reviewed at length with the patient today.  Concerns regarding medicines are outlined above. Medication changes, Labs and Tests ordered today are summarized above and listed in the Patient Instructions accessible in Encounters.   Signed, Gildardo Labrador,  PA-C 03/07/2024 2:01 PM     [facility] [place] [address]  [place], [place] [address] [phone]

## 2024-03-07 NOTE — Patient Instructions (Signed)
 Medication Instructions:  Your physician recommends that you continue on your current medications as directed. Please refer to the Current Medication list given to you today.   *If you need a refill on your cardiac medications before your next appointment, please call your pharmacy*  Lab Work: Your provider would like for you to have following labs drawn today CMP, Lipid.   If you have labs (blood work) drawn today and your tests are completely normal, you will receive your results only by: MyChart Message (if you have MyChart) OR A paper copy in the mail If you have any lab test that is abnormal or we need to change your treatment, we will call you to review the results.  Testing/Procedures: No test ordered today   Follow-Up: At [facility], you and your health needs are our priority.  As part of our continuing mission to provide you with exceptional heart care, our providers are all part of one team.  This team includes your primary Cardiologist (physician) and Advanced Practice Providers or APPs (Physician Assistants and Nurse Practitioners) who all work together to provide you with the care you need, when you need it.  Your next appointment:   12 month(s)  Provider:   Timothy Gollan, MD or Gildardo Labrador, PA-C

## 2024-03-08 ENCOUNTER — Ambulatory Visit: Payer: Self-pay | Admitting: Physician Assistant

## 2024-03-08 LAB — COMPREHENSIVE METABOLIC PANEL WITH GFR
ALT: 31 IU/L (ref 0–32)
AST: 35 IU/L (ref 0–40)
Albumin: 4.8 g/dL (ref 3.9–4.9)
Alkaline Phosphatase: 105 IU/L (ref 44–121)
BUN/Creatinine Ratio: 11 — ABNORMAL LOW (ref 12–28)
BUN: 11 mg/dL (ref 8–27)
Bilirubin Total: 0.4 mg/dL (ref 0.0–1.2)
CO2: 18 mmol/L — ABNORMAL LOW (ref 20–29)
Calcium: 10.5 mg/dL — ABNORMAL HIGH (ref 8.7–10.3)
Chloride: 100 mmol/L (ref 96–106)
Creatinine, Ser: 0.97 mg/dL (ref 0.57–1.00)
Globulin, Total: 3 g/dL (ref 1.5–4.5)
Glucose: 94 mg/dL (ref 70–99)
Potassium: 4.3 mmol/L (ref 3.5–5.2)
Sodium: 137 mmol/L (ref 134–144)
Total Protein: 7.8 g/dL (ref 6.0–8.5)
eGFR: 66 mL/min/{1.73_m2} (ref >59)

## 2024-03-08 LAB — LDL CHOLESTEROL, DIRECT: LDL Direct: 66 mg/dL (ref 0–99)

## 2024-03-13 ENCOUNTER — Other Ambulatory Visit: Payer: Self-pay | Admitting: Obstetrics and Gynecology

## 2024-03-13 ENCOUNTER — Encounter: Payer: Self-pay | Admitting: Obstetrics and Gynecology

## 2024-03-13 ENCOUNTER — Ambulatory Visit: Admitting: Obstetrics and Gynecology

## 2024-03-13 VITALS — BP 122/74 | HR 109 | Ht 64.0 in | Wt 165.0 lb

## 2024-03-13 DIAGNOSIS — F419 Anxiety disorder, unspecified: Secondary | ICD-10-CM | POA: Diagnosis not present

## 2024-03-13 DIAGNOSIS — N951 Menopausal and female climacteric states: Secondary | ICD-10-CM

## 2024-03-13 DIAGNOSIS — Z1231 Encounter for screening mammogram for malignant neoplasm of breast: Secondary | ICD-10-CM

## 2024-03-13 DIAGNOSIS — Z01419 Encounter for gynecological examination (general) (routine) without abnormal findings: Secondary | ICD-10-CM | POA: Diagnosis not present

## 2024-03-13 DIAGNOSIS — Z1211 Encounter for screening for malignant neoplasm of colon: Secondary | ICD-10-CM

## 2024-03-13 MED ORDER — PAROXETINE HCL 20 MG PO TABS
20.0000 mg | ORAL_TABLET | Freq: Every day | ORAL | 3 refills | Status: AC
Start: 2024-03-13 — End: ?

## 2024-03-13 NOTE — Progress Notes (Signed)
 PCP: Sharma Coyer, MD   Chief Complaint  Patient presents with   Gynecologic Exam    No concerns    HPI:      [name] is a 63 y.o. No obstetric history on file. who LMP was Patient's last menstrual period was 06/16/2016., presents today for her annual examination.  Her menses are absent due to menopause. Irreg bleeding resolved after endocervical leio removed 2017 with Dr. Leonce. She does not have PMB. No pelvic pain.  She no longer has vasomotor sx. Were tolerable with paxil  20 mg use. Due for Rx RF. She does take xanax  sporadically for episodic anxiety (about once wkly recently due to fam stressors; knows to take sparingly).   Sex activity: single partner, contraception - post menopausal status. She does not have vaginal dryness/pain/bleeding.  Last Pap: 02/07/23 Results were: no abnormalities /neg HPV DNA.  Hx of STDs: none  Last mammogram: 03/30/23 Results were: normal, repeat in 12 months There is no FH of breast cancer. There is a FH of ovarian cancer in her pat aunt. FH colon cancer in both her parents and PGF.  Pt is MyRisk neg 2016. The patient does do self-breast exams.  Colonoscopy: colonoscopy 9/20 with Dr. Jinny with pre-cancerous polyps. Repeat due after 5 years. Plans to schedule with GI. FH of colon cancer in mother and father. DEXA: 03/30/23 at [facility]; osteopenia in hip, normal spine. Done due to clavicle fx after fall  Tobacco use: The patient denies current or previous tobacco use. Alcohol use: none No drug use Exercise: moderately active  She does get adequate calcium  and Vitamin D in her diet.  Fasting labs with PCP.   Past Medical History:  Diagnosis Date   Abnormal vaginal bleeding    Anxiety    Cervical leiomyoma 02/2016   removed surg   GERD (gastroesophageal reflux disease)    Hiatal hernia    Hypertension    Renal insufficiency     Past Surgical History:  Procedure Laterality Date   CESAREAN SECTION     COLONOSCOPY   08/04/2006   Dr Dessa   COLONOSCOPY WITH PROPOFOL  N/A 06/15/2019   Procedure: COLONOSCOPY WITH Biopsy;  Surgeon: Jinny Carmine, MD;  Location: [facility];  Service: Endoscopy;  Laterality: N/A;   DILATATION & CURETTAGE/HYSTEROSCOPY WITH MYOSURE N/A 03/05/2016   Procedure: DILATATION & CURETTAGE/HYSTEROSCOPY WITH MYOSURE;  Surgeon: Garnette JONETTA Leonce, MD;  Location: [facility];  Service: Gynecology;  Laterality: N/A;   DILATION AND CURETTAGE OF UTERUS     POLYPECTOMY N/A 06/15/2019   Procedure: POLYPECTOMY;  Surgeon: Jinny Carmine, MD;  Location: [facility];  Service: Endoscopy;  Laterality: N/A;  Clip x1  placed as site of Ascending Colon Polyp Removal   TUBAL LIGATION     UPPER GI ENDOSCOPY  08/04/2006   Dr Dessa    Family History  Problem Relation Age of Onset   Hypertension Father    Heart disease Father        CABG x 3   Heart attack Father 75   Colon cancer Father    Liver cancer Father    Hypertension Sister    Hyperlipidemia Sister    Skin cancer Sister        71s   Hypertension Brother    Hypertension Sister    Hyperlipidemia Sister    Hypertension Mother    Colon cancer Mother 40       with mets.   Heart Problems Maternal Grandfather  Colon cancer Paternal Grandfather    Ovarian cancer Paternal Aunt 61   Breast cancer Neg Hx     Social History   Socioeconomic History   Marital status: Married    Spouse name: Not on file   Number of children: Not on file   Years of education: Not on file   Highest education level: Not on file  Occupational History   Not on file  Tobacco Use   Smoking status: Former    Current packs/day: 0.00    Average packs/day: 1 pack/day for 3.0 years (3.0 ttl pk-yrs)    Types: Cigarettes    Start date: 05/01/1982    Quit date: 05/01/1985    Years since quitting: 38.8   Smokeless tobacco: Never  Vaping Use   Vaping status: Never Used  Substance and Sexual Activity   Alcohol use: Yes    Alcohol/week: 14.0 standard  drinks of alcohol    Types: 14 Glasses of wine per week   Drug use: No   Sexual activity: Yes    Birth control/protection: Post-menopausal  Other Topics Concern   Not on file  Social History Narrative   Not on file   Social Drivers of Health   Financial Resource Strain: Low Risk  (11/30/2022)   Received from [facility]   Overall Financial Resource Strain (CARDIA)    Difficulty of Paying Living Expenses: Not hard at all  Food Insecurity: No Food Insecurity (11/30/2022)   Received from [facility]   Hunger Vital Sign    Within the past 12 months, you worried that your food would run out before you got the money to buy more.: Never true    Within the past 12 months, the food you bought just didn't last and you didn't have money to get more.: Never true  Transportation Needs: No Transportation Needs (11/30/2022)   Received from [facility] - Transportation    In the past 12 months, has lack of transportation kept you from medical appointments or from getting medications?: No    Lack of Transportation (Non-Medical): No  Physical Activity: Not on file  Stress: Not on file  Social Connections: Not on file  Intimate Partner Violence: Not on file    Current Meds  Medication Sig   ALPRAZolam  (XANAX ) 0.5 MG tablet Take 1 tablet (0.5 mg total) by mouth 2 (two) times daily as needed for anxiety.   amLODipine  (NORVASC ) 10 MG tablet Take 1 tablet (10 mg total) by mouth daily. Office visit needed for future refills   CALCIUM  CITRATE-VITAMIN D PO Take 630 mg by mouth daily.   ezetimibe  (ZETIA ) 10 MG tablet Take 1 tablet (10 mg total) by mouth daily.   hydrOXYzine  (ATARAX ) 25 MG tablet Take 1 tablet (25 mg total) by mouth every 6 (six) hours as needed for itching.   losartan  (COZAAR ) 100 MG tablet TAKE 1 TABLET BY MOUTH DAILY   rosuvastatin  (CRESTOR ) 20 MG tablet TAKE 1 TABLET BY MOUTH DAILY   [DISCONTINUED] PARoxetine  (PAXIL ) 20 MG  tablet Take 1 tablet (20 mg total) by mouth daily.      ROS:  Review of Systems  Constitutional:  Negative for fatigue, fever and unexpected weight change.  Respiratory:  Negative for cough, shortness of breath and wheezing.   Cardiovascular:  Negative for chest pain, palpitations and leg swelling.  Gastrointestinal:  Negative for blood in stool, constipation, diarrhea, nausea and vomiting.  Endocrine: Negative for cold intolerance,  heat intolerance and polyuria.  Genitourinary:  Negative for dyspareunia, dysuria, flank pain, frequency, genital sores, hematuria, menstrual problem, pelvic pain, urgency, vaginal bleeding, vaginal discharge and vaginal pain.  Musculoskeletal:  Negative for back pain, joint swelling and myalgias.  Skin:  Negative for rash.  Neurological:  Negative for dizziness, syncope, light-headedness, numbness and headaches.  Hematological:  Negative for adenopathy.  Psychiatric/Behavioral:  Negative for agitation, confusion, sleep disturbance and suicidal ideas. The patient is not nervous/anxious.      Objective: BP 122/74   Pulse (!) 109   Ht 5' 4 (1.626 m)   Wt 165 lb (74.8 kg)   LMP 06/16/2016   BMI 28.32 kg/m    Physical Exam Constitutional:      Appearance: She is well-developed.  Genitourinary:     Vulva normal.     Right Labia: No rash, tenderness or lesions.    Left Labia: No tenderness, lesions or rash.    No vaginal discharge, erythema or tenderness.     Mild vaginal atrophy present.     Right Adnexa: not tender and no mass present.    Left Adnexa: not tender and no mass present.    No cervical motion tenderness, friability or polyp.     Uterus is not enlarged or tender.  Breasts:    Right: No mass, nipple discharge, skin change or tenderness.     Left: No mass, nipple discharge, skin change or tenderness.  Neck:     Thyroid: No thyromegaly.   Cardiovascular:     Rate and Rhythm: Normal rate and regular rhythm.     Heart sounds:  Normal heart sounds. No murmur heard. Pulmonary:     Effort: Pulmonary effort is normal.     Breath sounds: Normal breath sounds.  Abdominal:     Palpations: Abdomen is soft.     Tenderness: There is no abdominal tenderness. There is no guarding or rebound.   Musculoskeletal:        General: Normal range of motion.     Cervical back: Normal range of motion.  Lymphadenopathy:     Cervical: No cervical adenopathy.   Neurological:     General: No focal deficit present.     Mental Status: She is alert and oriented to person, place, and time.     Cranial Nerves: No cranial nerve deficit.   Skin:    General: Skin is warm and dry.   Psychiatric:        Mood and Affect: Mood normal.        Behavior: Behavior normal.        Thought Content: Thought content normal.        Judgment: Judgment normal.  Vitals reviewed.     Assessment/Plan: Encounter for annual routine gynecological examination  Encounter for screening mammogram for malignant neoplasm of breast - Plan: MM 3D SCREENING MAMMOGRAM BILATERAL BREAST; pt to schedule mammo  Vasomotor symptoms due to menopause - Plan: PARoxetine  (PAXIL ) 20 MG tablet; sx resolved. Cont paxil .   Anxiety - Plan: PARoxetine  (PAXIL ) 20 MG tablet; Rx RF eRxd, doing well.   Screening for colon cancer--pt will call to schedule with GI   Meds ordered this encounter  Medications   PARoxetine  (PAXIL ) 20 MG tablet    Sig: Take 1 tablet (20 mg total) by mouth daily.    Dispense:  90 tablet    Refill:  3    Supervising Provider:   ROBY, MICIA [[id number]]  GYN counsel breast self exam, mammography screening, menopause, adequate intake of calcium  and vitamin D, diet and exercise    F/U  Return in about 1 year (around 03/13/2025).  Mary Pennywell B. Joann Jorge, PA-C 03/13/2024 2:25 PM

## 2024-03-13 NOTE — Patient Instructions (Addendum)
 I value your feedback and you entrusting [place] with your care. If you get a [facility] patient survey, I would appreciate you taking the time to let us know about your experience today. Thank you!  [facility] ([place])--[phone]

## 2024-04-09 ENCOUNTER — Encounter: Payer: Self-pay | Admitting: Family Medicine

## 2024-04-19 ENCOUNTER — Ambulatory Visit: Admitting: Family Medicine

## 2024-04-19 ENCOUNTER — Other Ambulatory Visit: Payer: Self-pay | Admitting: Cardiology

## 2024-04-19 ENCOUNTER — Other Ambulatory Visit: Payer: Self-pay | Admitting: Cardiovascular Disease

## 2024-04-23 ENCOUNTER — Encounter: Payer: Self-pay | Admitting: Family Medicine

## 2024-04-23 ENCOUNTER — Ambulatory Visit: Admitting: Family Medicine

## 2024-04-23 VITALS — BP 146/92 | HR 108 | Temp 98.3°F | Ht 64.0 in | Wt 164.2 lb

## 2024-04-23 DIAGNOSIS — F1090 Alcohol use, unspecified, uncomplicated: Secondary | ICD-10-CM | POA: Diagnosis not present

## 2024-04-23 MED ORDER — NALTREXONE HCL 50 MG PO TABS: 25.0000 mg | ORAL_TABLET | Freq: Every day | ORAL | 1 refills | Status: AC

## 2024-04-23 NOTE — Progress Notes (Signed)
 Established Patient Office Visit  Introduced to nurse practitioner role and practice setting.  All questions answered.  Discussed provider/patient relationship and expectations.  Subjective   Patient ID: Mary Christian, female    DOB: [birth year]  Age: 63 y.o. MRN: [id number]  Chief Complaint  Patient presents with   Alcohol Problem   Discussed the use of AI scribe software for clinical note transcription with the patient, who gave verbal consent to proceed.  History of Present Illness Mary Christian is a 63 year old female who presents with concerns about her alcohol use. She is accompanied by her husband.  Alcohol use disorder - Consumes approximately one bottle of wine daily, primarily white wine, with occasional red wine during dinner - Alcohol consumption typically occurs in the afternoons, especially at the beach - Perceives alcohol use as increasingly controlling and has struggled to quit over the past couple of years - Previous attempts to quit included abstaining for one month and for a few weeks, but resumed drinking each time - Difficulty sleeping without alcohol, describing symptoms similar to caffeine withdrawal - Mother's recent death has been a significant stressor contributing to increased alcohol use - Used naltrexone  25 mg daily during the COVID-19 pandemic, which was effective in helping her quit drinking - Currently not taking any medication for alcohol use  Sleep disturbance - Difficulty sleeping without alcohol - Describes sleep disruption as similar to caffeine withdrawal  Anxiety and vasomotor symptoms - Takes Paxil  for anxiety and hot flashes - Experiences restlessness when missing doses of Paxil   Nutritional supplementation - Takes a women's over fifty daily vitamin - Recently ran out of B12 and folate supplements  Blood pressure variability - Monitors blood pressure at home after a recent slightly elevated reading in a medical setting - Home blood  pressure readings are within normal range, but higher readings occur in medical settings         04/23/2024    2:29 PM 10/27/2022    1:16 PM 06/04/2022    2:13 PM  Depression screen PHQ 2/9  Decreased Interest 0 0 0  Down, Depressed, Hopeless 0 0 0  PHQ - 2 Score 0 0 0  Altered sleeping   0  Tired, decreased energy   0  Change in appetite   0  Feeling bad or failure about yourself    0  Trouble concentrating   0  Moving slowly or fidgety/restless   0  Suicidal thoughts   0  PHQ-9 Score   0  Difficult doing work/chores   Not difficult at all       04/23/2024    2:29 PM  GAD 7 : Generalized Anxiety Score  Nervous, Anxious, on Edge 0  Control/stop worrying 0  Worry too much - different things 0  Trouble relaxing 0  Restless 1  Easily annoyed or irritable 0  Afraid - awful might happen 0  Total GAD 7 Score 1  Anxiety Difficulty Not difficult at all   Flowsheet Row Office Visit from 04/23/2024 in [facility]  AUDIT-C Score 7   Flowsheet Row Office Visit from 04/23/2024 in [facility]  Alcohol Use Disorder Identification Test Final Score (AUDIT) 13    ROS  Negative unless indicated in HPI   Objective:     BP (!) 146/92 (BP Location: Right Arm, Patient Position: Sitting, Cuff Size: Normal)   Pulse (!) 108   Temp 98.3 F (36.8 C) (Oral)  Ht 5' 4 (1.626 m)   Wt 164 lb 3.2 oz (74.5 kg)   LMP 06/16/2016   SpO2 98%   BMI 28.18 kg/m    Physical Exam Constitutional:      General: She is not in acute distress.    Appearance: Normal appearance. She is overweight. She is not toxic-appearing or diaphoretic.  HENT:     Head: Normocephalic.     Nose: Nose normal.     Mouth/Throat:     Mouth: Mucous membranes are moist.     Pharynx: Oropharynx is clear.  Eyes:     Extraocular Movements: Extraocular movements intact.     Pupils: Pupils are equal, round, and reactive to light.  Cardiovascular:     Rate and Rhythm:  Regular rhythm. Tachycardia present.     Pulses: Normal pulses.     Heart sounds: Normal heart sounds. No murmur heard.    No friction rub. No gallop.  Pulmonary:     Effort: No respiratory distress.     Breath sounds: No stridor. No wheezing, rhonchi or rales.  Chest:     Chest wall: No tenderness.  Musculoskeletal:     Right lower leg: No edema.     Left lower leg: No edema.  Skin:    General: Skin is warm and dry.     Capillary Refill: Capillary refill takes less than 2 seconds.  Neurological:     General: No focal deficit present.     Mental Status: She is alert and oriented to person, place, and time. Mental status is at baseline.  Psychiatric:        Attention and Perception: Attention and perception normal.        Mood and Affect: Mood is anxious. Affect is flat.        Behavior: Behavior is withdrawn. Behavior is cooperative.        Thought Content: Thought content normal.        Cognition and Memory: Cognition and memory normal.        Judgment: Judgment normal.      No results found for any visits on 04/23/24.    The 10-year ASCVD risk score (Arnett DK, et al., 2019) is: 4.2%    Assessment & Plan:  Alcohol use disorder -     Vitamin B12 -     VITAMIN D  25 Hydroxy (Vit-D Deficiency, Fractures) -     Comprehensive metabolic panel with GFR -     Vitamin B1 -     Folate -     Ambulatory referral to Psychiatry -     Naltrexone  HCl; Take 0.5 tablets (25 mg total) by mouth daily.  Dispense: 15 tablet; Refill: 1     Assessment and Plan Assessment & Plan Alcohol use disorder Chronic alcohol use disorder with daily consumption of approximately one bottle of white wine. Concerned about inability to quit and potential withdrawal symptoms. Previous naltrexone  use effective at lower dose. Motivated to reduce intake. Discussed withdrawal risks and potential hospitalization. Referral to psychiatry recommended for alcohol use disorder - Prescribe naltrexone  25mg  daily for  AUD - Order lab tests for D, B12, folate, and thiamine  levels. - Refer to behavioral health for substance use disorder management. - Follow-up in 3-4 weeks to assess progress and treatment response with PCP  Hypertension Blood pressure variable with recent elevations. Anxiety may contribute to elevated readings during visits. Home monitoring advised by cardiologist - GOAL<130/80 Continue amlodipine  10mg  daily Continue cozaar  100mg  daily - high  today - given anxiety of conservation of alcohol use - Continue home blood pressure monitoring.  Anxiety disorder Managed with Paxil , also used for hot flashes. Sensitivity to medication changes noted. Anxiety may elevate blood pressure during visits. - Continue current Paxil  regimen. - Monitor for changes in anxiety symptoms, especially with alcohol use reduction.  Return in about 4 weeks (around 05/21/2024) for with PCP for AUD.   I, Curtis DELENA Boom, FNP, have reviewed all documentation for this visit. The documentation on 04/23/24 for the exam, diagnosis, procedures, and orders are all accurate and complete.  Curtis DELENA Boom, FNP

## 2024-04-24 ENCOUNTER — Other Ambulatory Visit: Payer: Self-pay

## 2024-04-24 DIAGNOSIS — Z860100 Personal history of colon polyps, unspecified: Secondary | ICD-10-CM

## 2024-04-24 MED ORDER — NA SULFATE-K SULFATE-MG SULF 17.5-3.13-1.6 GM/177ML PO SOLN: 354.0000 mL | Freq: Once | ORAL | 0 refills | Status: AC

## 2024-04-25 ENCOUNTER — Ambulatory Visit: Payer: Self-pay | Admitting: Family Medicine

## 2024-04-26 LAB — COMPREHENSIVE METABOLIC PANEL WITH GFR
ALT: 41 IU/L — ABNORMAL HIGH (ref 0–32)
AST: 43 IU/L — ABNORMAL HIGH (ref 0–40)
Albumin: 4.8 g/dL (ref 3.9–4.9)
Alkaline Phosphatase: 118 IU/L (ref 44–121)
BUN/Creatinine Ratio: 16 (ref 12–28)
BUN: 15 mg/dL (ref 8–27)
Bilirubin Total: 0.3 mg/dL (ref 0.0–1.2)
CO2: 18 mmol/L — ABNORMAL LOW (ref 20–29)
Calcium: 10.6 mg/dL — ABNORMAL HIGH (ref 8.7–10.3)
Chloride: 99 mmol/L (ref 96–106)
Creatinine, Ser: 0.96 mg/dL (ref 0.57–1.00)
Globulin, Total: 2.9 g/dL (ref 1.5–4.5)
Glucose: 90 mg/dL (ref 70–99)
Potassium: 4.7 mmol/L (ref 3.5–5.2)
Sodium: 137 mmol/L (ref 134–144)
Total Protein: 7.7 g/dL (ref 6.0–8.5)
eGFR: 67 mL/min/1.73 (ref >59)

## 2024-04-26 LAB — VITAMIN B1: Thiamine: 169.6 nmol/L (ref 66.5–200.0)

## 2024-04-26 LAB — VITAMIN D 25 HYDROXY (VIT D DEFICIENCY, FRACTURES): Vit D, 25-Hydroxy: 50.7 ng/mL (ref 30.0–100.0)

## 2024-04-26 LAB — VITAMIN B12: Vitamin B-12: 306 pg/mL (ref 232–1245)

## 2024-04-26 LAB — FOLATE: Folate: >20 ng/mL (ref >3.0)

## 2024-05-18 ENCOUNTER — Other Ambulatory Visit: Payer: Self-pay | Admitting: Cardiovascular Disease

## 2024-05-31 ENCOUNTER — Ambulatory Visit: Admitting: Family Medicine

## 2024-06-14 ENCOUNTER — Other Ambulatory Visit: Payer: Self-pay | Admitting: Cardiology

## 2024-07-13 NOTE — Progress Notes (Signed)
 Mary Christian                                          MRN: [id number]   07/13/2024   The [facility] Quality Team Specialist reviewed this patient medical record for the purposes of chart review for care gap closure. The following were reviewed: chart review for care gap closure-controlling blood pressure.    [facility] Quality Team

## 2024-07-17 ENCOUNTER — Other Ambulatory Visit: Payer: Self-pay | Admitting: *Deleted

## 2024-07-17 MED ORDER — CARVEDILOL 6.25 MG PO TABS: 6.2500 mg | ORAL_TABLET | Freq: Two times a day (BID) | ORAL | 3 refills | Status: AC

## 2024-07-25 ENCOUNTER — Other Ambulatory Visit: Payer: Self-pay | Admitting: Medical Genetics

## 2024-07-31 ENCOUNTER — Encounter
Admission: RE | Disposition: A | Discharge status: Home / Self Care | Payer: Self-pay | Source: Home / Self Care | Attending: Gastroenterology

## 2024-07-31 ENCOUNTER — Encounter: Payer: Self-pay | Admitting: Gastroenterology

## 2024-07-31 ENCOUNTER — Ambulatory Visit: Admitting: Anesthesiology

## 2024-07-31 ENCOUNTER — Ambulatory Visit
Admission: RE | Admit: 2024-07-31 | Discharge: 2024-07-31 | Disposition: A | Discharge status: Home / Self Care | Attending: Gastroenterology | Admitting: Gastroenterology

## 2024-07-31 DIAGNOSIS — Z800 Family history of malignant neoplasm of digestive organs: Secondary | ICD-10-CM | POA: Insufficient documentation

## 2024-07-31 DIAGNOSIS — K635 Polyp of colon: Secondary | ICD-10-CM | POA: Diagnosis not present

## 2024-07-31 DIAGNOSIS — Z1211 Encounter for screening for malignant neoplasm of colon: Secondary | ICD-10-CM | POA: Insufficient documentation

## 2024-07-31 DIAGNOSIS — D128 Benign neoplasm of rectum: Secondary | ICD-10-CM

## 2024-07-31 DIAGNOSIS — Z860101 Personal history of adenomatous and serrated colon polyps: Secondary | ICD-10-CM | POA: Diagnosis not present

## 2024-07-31 DIAGNOSIS — D122 Benign neoplasm of ascending colon: Secondary | ICD-10-CM | POA: Insufficient documentation

## 2024-07-31 DIAGNOSIS — Z87891 Personal history of nicotine dependence: Secondary | ICD-10-CM | POA: Insufficient documentation

## 2024-07-31 DIAGNOSIS — Z860100 Personal history of colon polyps, unspecified: Secondary | ICD-10-CM

## 2024-07-31 DIAGNOSIS — Z79899 Other long term (current) drug therapy: Secondary | ICD-10-CM | POA: Insufficient documentation

## 2024-07-31 DIAGNOSIS — D120 Benign neoplasm of cecum: Secondary | ICD-10-CM | POA: Insufficient documentation

## 2024-07-31 DIAGNOSIS — F419 Anxiety disorder, unspecified: Secondary | ICD-10-CM | POA: Diagnosis not present

## 2024-07-31 DIAGNOSIS — E785 Hyperlipidemia, unspecified: Secondary | ICD-10-CM | POA: Diagnosis not present

## 2024-07-31 DIAGNOSIS — D124 Benign neoplasm of descending colon: Secondary | ICD-10-CM | POA: Diagnosis not present

## 2024-07-31 DIAGNOSIS — I10 Essential (primary) hypertension: Secondary | ICD-10-CM | POA: Diagnosis not present

## 2024-07-31 DIAGNOSIS — K219 Gastro-esophageal reflux disease without esophagitis: Secondary | ICD-10-CM | POA: Insufficient documentation

## 2024-07-31 HISTORY — PX: COLONOSCOPY: SHX5424

## 2024-07-31 HISTORY — PX: POLYPECTOMY: SHX149

## 2024-07-31 SURGERY — COLONOSCOPY
Anesthesia: General

## 2024-07-31 MED ORDER — SODIUM CHLORIDE 0.9 % IV SOLN: INTRAVENOUS | Status: DC

## 2024-07-31 MED ORDER — PROPOFOL 1000 MG/100ML IV EMUL
INTRAVENOUS | Status: AC
  Filled 2024-07-31: qty 100

## 2024-07-31 MED ORDER — LIDOCAINE HCL (PF) 2 % IJ SOLN
INTRAMUSCULAR | Status: DC | PRN
  Administered 2024-07-31: 40 mg via INTRADERMAL

## 2024-07-31 MED ORDER — PROPOFOL 10 MG/ML IV BOLUS
INTRAVENOUS | Status: DC | PRN
  Administered 2024-07-31 (×2): 40 mg via INTRAVENOUS
  Administered 2024-07-31: 20 mg via INTRAVENOUS
  Administered 2024-07-31: 40 mg via INTRAVENOUS
  Administered 2024-07-31: 20 mg via INTRAVENOUS

## 2024-07-31 NOTE — Anesthesia Postprocedure Evaluation (Signed)
 Anesthesia Post Note  Patient: Mary Christian  Procedure(s) Performed: COLONOSCOPY POLYPECTOMY, INTESTINE  Patient location during evaluation: Endoscopy Anesthesia Type: General Level of consciousness: awake and alert Pain management: pain level controlled Vital Signs Assessment: post-procedure vital signs reviewed and stable Respiratory status: spontaneous breathing, nonlabored ventilation, respiratory function stable and patient connected to nasal cannula oxygen Cardiovascular status: blood pressure returned to baseline and stable Postop Assessment: no apparent nausea or vomiting Anesthetic complications: no   No notable events documented.   Last Vitals:  Vitals:   07/31/24 0849 07/31/24 0854  BP: 111/85 117/80  Pulse: 92 81  Resp: 16 17  Temp:    SpO2: 98% 100%    Last Pain:  Vitals:   07/31/24 0854  TempSrc:   PainSc: 0-No pain                 Lendia LITTIE Mae

## 2024-07-31 NOTE — Anesthesia Preprocedure Evaluation (Signed)
 Anesthesia Evaluation  Patient identified by MRN, date of birth, ID band Patient awake    Reviewed: Allergy & Precautions, NPO status , Patient's Chart, lab work & pertinent test results  History of Anesthesia Complications Negative for: history of anesthetic complications  Airway Mallampati: III  TM Distance: >3 FB Neck ROM: full    Dental no notable dental hx.    Pulmonary neg pulmonary ROS, former smoker   Pulmonary exam normal        Cardiovascular hypertension, On Medications negative cardio ROS Normal cardiovascular exam     Neuro/Psych  PSYCHIATRIC DISORDERS Anxiety     negative neurological ROS  negative psych ROS   GI/Hepatic negative GI ROS, Neg liver ROS, hiatal hernia,GERD  ,,  Endo/Other  negative endocrine ROS    Renal/GU negative Renal ROS  negative genitourinary   Musculoskeletal   Abdominal   Peds  Hematology negative hematology ROS (+)   Anesthesia Other Findings Past Medical History: No date: Abnormal vaginal bleeding No date: Anxiety 02/2016: Cervical leiomyoma     Comment:  removed surg No date: GERD (gastroesophageal reflux disease) No date: Hiatal hernia No date: Hypertension No date: Renal insufficiency  Past Surgical History: No date: CESAREAN SECTION 08/04/2006: COLONOSCOPY     Comment:  Dr Dessa 06/15/2019: COLONOSCOPY WITH PROPOFOL ; N/A     Comment:  Procedure: COLONOSCOPY WITH Biopsy;  Surgeon: Jinny Carmine, MD;  Location: [facility];  Service:               Endoscopy;  Laterality: N/A; 03/05/2016: DILATATION & CURETTAGE/HYSTEROSCOPY WITH MYOSURE; N/A     Comment:  Procedure: DILATATION & CURETTAGE/HYSTEROSCOPY WITH               MYOSURE;  Surgeon: Garnette JONETTA Mace, MD;  Location: [facility]              ORS;  Service: Gynecology;  Laterality: N/A; No date: DILATION AND CURETTAGE OF UTERUS 06/15/2019: POLYPECTOMY; N/A     Comment:  Procedure: POLYPECTOMY;   Surgeon: Jinny Carmine, MD;                Location: [facility];  Service: Endoscopy;                Laterality: N/A;  Clip x1  placed as site of Ascending               Colon Polyp Removal No date: TUBAL LIGATION 08/04/2006: UPPER GI ENDOSCOPY     Comment:  Dr Dessa     Reproductive/Obstetrics negative OB ROS                              Anesthesia Physical Anesthesia Plan  ASA: 2  Anesthesia Plan: General   Post-op Pain Management: Minimal or no pain anticipated   Induction:   PONV Risk Score and Plan: 2 and Propofol  infusion and TIVA  Airway Management Planned:   Additional Equipment:   Intra-op Plan:   Post-operative Plan:   Informed Consent: I have reviewed the patients History and Physical, chart, labs and discussed the procedure including the risks, benefits and alternatives for the proposed anesthesia with the patient or authorized representative who has indicated his/her understanding and acceptance.     Dental Advisory Given  Plan Discussed with: Anesthesiologist, CRNA and Surgeon  Anesthesia Plan Comments:  Anesthesia Quick Evaluation

## 2024-07-31 NOTE — Op Note (Signed)
 [facility]astroenter[facility] Patient Name: Mary Christian Procedure Date: 07/31/2024 8:10 AM MRN: [id number] Account #: [id number] Date of Birth: [birth year] Admit Type: Outpatient Age: 63 Room: [facility] ENDO ROOM 4 Gender: Female Note Status: Finalized Instrument Name: Colon Scope [id number] Procedure:             Colonoscopy Indications:           High risk colon cancer surveillance: Personal history                         of colonic polyps Providers:             Rogelia Copping MD, MD Referring MD:          Marcine Marina Medicines:             Propofol  per Anesthesia Complications:         No immediate complications. Procedure:             Pre-Anesthesia Assessment:                        - Prior to the procedure, a History and Physical was                         performed, and patient medications and allergies were                         reviewed. The patient's tolerance of previous                         anesthesia was also reviewed. The risks and benefits                         of the procedure and the sedation options and risks                         were discussed with the patient. All questions were                         answered, and informed consent was obtained. Prior                         Anticoagulants: The patient has taken no anticoagulant                         or antiplatelet agents. ASA Grade Assessment: II - A                         patient with mild systemic disease. After reviewing                         the risks and benefits, the patient was deemed in                         satisfactory condition to undergo the procedure.                        After obtaining informed consent, the colonoscope was  passed under direct vision. Throughout the procedure,                         the patient's blood pressure, pulse, and oxygen                         saturations were monitored continuously. The                          Colonoscope was introduced through the anus and                         advanced to the the cecum, identified by appendiceal                         orifice and ileocecal valve. The colonoscopy was                         performed without difficulty. The patient tolerated                         the procedure well. The quality of the bowel                         preparation was excellent. Findings:      The perianal and digital rectal examinations were normal.      A 4 mm polyp was found in the cecum. The polyp was sessile. The polyp       was removed with a cold snare. Resection and retrieval were complete.      Six sessile polyps were found in the ascending colon. The polyps were 3       to 7 mm in size. These polyps were removed with a cold snare. Resection       and retrieval were complete.      A 4 mm polyp was found in the descending colon. The polyp was sessile.       The polyp was removed with a cold snare. Resection and retrieval were       complete.      A 7 mm polyp was found in the rectum. The polyp was sessile. The polyp       was removed with a cold snare. Resection and retrieval were complete. Impression:            - One 4 mm polyp in the cecum, removed with a cold                         snare. Resected and retrieved.                        - Six 3 to 7 mm polyps in the ascending colon, removed                         with a cold snare. Resected and retrieved.                        - One 4 mm polyp in the descending colon, removed with  a cold snare. Resected and retrieved.                        - One 7 mm polyp in the rectum, removed with a cold                         snare. Resected and retrieved. Recommendation:        - Discharge patient to home.                        - Resume previous diet.                        - Continue present medications.                        - Await pathology results.                        - Repeat colonoscopy  in 3 years for surveillance. Procedure Code(s):     --- Professional ---                        [phone], Colonoscopy, flexible; with removal of                         tumor(s), polyp(s), or other lesion(s) by snare                         technique Diagnosis Code(s):     --- Professional ---                        Z86.010, Personal history of colonic polyps                        D12.4, Benign neoplasm of descending colon CPT copyright 2022 American Medical Association. All rights reserved. The codes documented in this report are preliminary and upon coder review may  be revised to meet current compliance requirements. Rogelia Copping MD, MD 07/31/2024 8:38:42 AM This report has been signed electronically. Number of Addenda: 0 Note Initiated On: 07/31/2024 8:10 AM Scope Withdrawal Time: 0 hours 12 minutes 27 seconds  Total Procedure Duration: 0 hours 16 minutes 29 seconds  Estimated Blood Loss:  Estimated blood loss: none.      [facility]

## 2024-07-31 NOTE — Transfer of Care (Signed)
 Immediate Anesthesia Transfer of Care Note  Patient: Mary Christian  Procedure(s) Performed: COLONOSCOPY POLYPECTOMY, INTESTINE  Patient Location: PACU  Anesthesia Type:MAC  Level of Consciousness: drowsy  Airway & Oxygen Therapy: Patient Spontanous Breathing and Patient connected to nasal cannula oxygen  Post-op Assessment: Report given to RN and Post -op Vital signs reviewed and stable  Post vital signs: Reviewed and stable  Last Vitals:  Vitals Value Taken Time  BP 93/62 07/31/24 08:39  Temp 35.8 C 07/31/24 08:39  Pulse 80 07/31/24 08:40  Resp 18 07/31/24 08:40  SpO2 94 % 07/31/24 08:40  Vitals shown include unfiled device data.  Last Pain:  Vitals:   07/31/24 0839  TempSrc: Tympanic  PainSc: 0-No pain         Complications: No notable events documented.

## 2024-07-31 NOTE — H&P (Signed)
 Rogelia Copping, MD [facility] [address] [place], [place] [address] [phone] Fax : [phone]  Primary Care Physician:  Sharma Coyer, MD Primary Gastroenterologist:  Dr. Copping  Pre-Procedure History & Physical: HPI:  Mary Christian is a 63 y.o. female is here for an colonoscopy.   Past Medical History:  Diagnosis Date   Abnormal vaginal bleeding    Anxiety    Cervical leiomyoma 02/2016   removed surg   GERD (gastroesophageal reflux disease)    Hiatal hernia    Hypertension    Renal insufficiency     Past Surgical History:  Procedure Laterality Date   CESAREAN SECTION     COLONOSCOPY  08/04/2006   Dr Dessa   COLONOSCOPY WITH PROPOFOL  N/A 06/15/2019   Procedure: COLONOSCOPY WITH Biopsy;  Surgeon: Copping Rogelia, MD;  Location: [facility];  Service: Endoscopy;  Laterality: N/A;   DILATATION & CURETTAGE/HYSTEROSCOPY WITH MYOSURE N/A 03/05/2016   Procedure: DILATATION & CURETTAGE/HYSTEROSCOPY WITH MYOSURE;  Surgeon: Garnette JONETTA Mace, MD;  Location: [facility] ORS;  Service: Gynecology;  Laterality: N/A;   DILATION AND CURETTAGE OF UTERUS     POLYPECTOMY N/A 06/15/2019   Procedure: POLYPECTOMY;  Surgeon: Copping Rogelia, MD;  Location: [facility];  Service: Endoscopy;  Laterality: N/A;  Clip x1  placed as site of Ascending Colon Polyp Removal   TUBAL LIGATION     UPPER GI ENDOSCOPY  08/04/2006   Dr Dessa    Prior to Admission medications   Medication Sig Start Date End Date Taking? Authorizing Provider  carvedilol (COREG) 6.25 MG tablet Take 1 tablet (6.25 mg total) by mouth 2 (two) times daily. 07/17/24  Yes Lorene, Mariah L, PA-C  losartan  (COZAAR ) 100 MG tablet TAKE 1 TABLET BY MOUTH DAILY 11/04/23  Yes Gollan, Timothy J, MD  methocarbamol (ROBAXIN) 750 MG tablet Take 750 mg by mouth 4 (four) times daily.   Yes [provider]  omeprazole (PRILOSEC) 20 MG capsule Take 20 mg by mouth daily.   Yes [provider]   PARoxetine  (PAXIL ) 20 MG tablet Take 1 tablet (20 mg total) by mouth daily. 03/13/24  Yes Copland, Alicia B, PA-C  ALPRAZolam  (XANAX ) 0.5 MG tablet Take 1 tablet (0.5 mg total) by mouth 2 (two) times daily as needed for anxiety. 09/08/23   Copland, Alicia B, PA-C  amLODipine  (NORVASC ) 10 MG tablet TAKE 1 TABLET DAILY. CALL OFFICE FOR FURTHER REFILLS 05/18/24   Gollan, Timothy J, MD  CALCIUM  CITRATE-VITAMIN D  PO Take 630 mg by mouth daily.    [provider]  ezetimibe  (ZETIA ) 10 MG tablet TAKE 1 TABLET BY MOUTH DAILY 06/14/24   Gollan, Timothy J, MD  hydrOXYzine  (ATARAX ) 25 MG tablet Take 1 tablet (25 mg total) by mouth every 6 (six) hours as needed for itching. 06/03/23   Copland, Alicia B, PA-C  naltrexone  (DEPADE) 50 MG tablet Take 0.5 tablets (25 mg total) by mouth daily. 04/23/24   Wellington Curtis LABOR, FNP  rosuvastatin  (CRESTOR ) 20 MG tablet TAKE 1 TABLET BY MOUTH DAILY 01/26/24   Gerard Frederick, NP    Allergies as of 04/24/2024 - Review Complete 04/23/2024  Allergen Reaction Noted   Aspirin Other (See Comments) 10/22/2022    Family History  Problem Relation Age of Onset   Hypertension Father    Heart disease Father        CABG x 3   Heart attack Father 27   Colon cancer Father    Liver cancer Father  Hypertension Sister    Hyperlipidemia Sister    Skin cancer Sister        66s   Hypertension Brother    Hypertension Sister    Hyperlipidemia Sister    Hypertension Mother    Colon cancer Mother 59       with mets.   Heart Problems Maternal Grandfather    Colon cancer Paternal Grandfather    Ovarian cancer Paternal Aunt 47   Breast cancer Neg Hx     Social History   Socioeconomic History   Marital status: Married    Spouse name: Not on file   Number of children: Not on file   Years of education: Not on file   Highest education level: Not on file  Occupational History   Not on file  Tobacco Use   Smoking status: Former    Current packs/day: 0.00    Average  packs/day: 1 pack/day for 3.0 years (3.0 ttl pk-yrs)    Types: Cigarettes    Start date: 05/01/1982    Quit date: 05/01/1985    Years since quitting: 39.2   Smokeless tobacco: Never  Vaping Use   Vaping status: Never Used  Substance and Sexual Activity   Alcohol use: Yes    Alcohol/week: 14.0 standard drinks of alcohol    Types: 14 Glasses of wine per week   Drug use: No   Sexual activity: Yes    Birth control/protection: Post-menopausal  Other Topics Concern   Not on file  Social History Narrative   Not on file   Social Drivers of Health   Financial Resource Strain: Low Risk  (11/30/2022)   Received from [facility]   Overall Financial Resource Strain (CARDIA)    Difficulty of Paying Living Expenses: Not hard at all  Food Insecurity: No Food Insecurity (11/30/2022)   Received from [facility]   Hunger Vital Sign    Within the past 12 months, you worried that your food would run out before you got the money to buy more.: Never true    Within the past 12 months, the food you bought just didn't last and you didn't have money to get more.: Never true  Transportation Needs: No Transportation Needs (11/30/2022)   Received from [facility] - Transportation    In the past 12 months, has lack of transportation kept you from medical appointments or from getting medications?: No    Lack of Transportation (Non-Medical): No  Physical Activity: Not on file  Stress: Not on file  Social Connections: Not on file  Intimate Partner Violence: Not on file    Review of Systems: See HPI, otherwise negative ROS  Physical Exam: BP (!) 147/92   Pulse 88   Temp (!) 96.7 F (35.9 C) (Temporal)   Resp 20   Ht 5' 4 (1.626 m)   Wt 73 kg   LMP 06/16/2016   BMI 27.64 kg/m  General:   Alert,  pleasant and cooperative in NAD Head:  Normocephalic and atraumatic. Neck:  Supple; no masses or thyromegaly. Lungs:  Clear throughout to  auscultation.    Heart:  Regular rate and rhythm. Abdomen:  Soft, nontender and nondistended. Normal bowel sounds, without guarding, and without rebound.   Neurologic:  Alert and  oriented x4;  grossly normal neurologically.  Impression/Plan: Mary Christian is here for an colonoscopy to be performed for a history of adenomatous polyps on 2020   Risks, benefits, limitations,  and alternatives regarding  colonoscopy have been reviewed with the patient.  Questions have been answered.  All parties agreeable.   Rogelia Copping, MD  07/31/2024, 7:38 AM

## 2024-08-01 LAB — SURGICAL PATHOLOGY

## 2024-08-02 ENCOUNTER — Ambulatory Visit: Payer: Self-pay | Admitting: Gastroenterology

## 2024-08-29 ENCOUNTER — Encounter: Payer: Self-pay | Admitting: Gastroenterology

## 2024-08-31 ENCOUNTER — Encounter

## 2024-09-05 ENCOUNTER — Other Ambulatory Visit: Payer: Self-pay | Admitting: Cardiovascular Disease

## 2024-09-11 ENCOUNTER — Encounter

## 2024-09-13 ENCOUNTER — Other Ambulatory Visit: Payer: Self-pay | Admitting: Cardiology

## 2024-09-24 ENCOUNTER — Other Ambulatory Visit: Payer: Self-pay | Admitting: Cardiovascular Disease

## 2024-09-24 NOTE — Telephone Encounter (Signed)
 Pt needs to schedule appointment  with Cardiology-Gollan for any more refills. [phone] 2nd attempt Thank You

## 2024-10-17 ENCOUNTER — Other Ambulatory Visit: Payer: Self-pay | Admitting: Cardiovascular Disease

## 2024-10-24 ENCOUNTER — Ambulatory Visit
Admission: RE | Admit: 2024-10-24 | Discharge: 2024-10-24 | Disposition: A | Payer: Self-pay | Source: Ambulatory Visit | Attending: Obstetrics and Gynecology

## 2024-10-24 DIAGNOSIS — Z1231 Encounter for screening mammogram for malignant neoplasm of breast: Secondary | ICD-10-CM
